# Patient Record
Sex: Female | Born: 1955 | Race: Black or African American | Hispanic: No | Marital: Married | Smoking: Never smoker
Health system: Southern US, Community
[De-identification: ages and names within clinical notes are randomized; demographics above are authoritative.]

## PROBLEM LIST (undated history)

## (undated) DIAGNOSIS — R51 Headache: Secondary | ICD-10-CM

## (undated) DIAGNOSIS — F419 Anxiety disorder, unspecified: Secondary | ICD-10-CM

## (undated) DIAGNOSIS — I10 Essential (primary) hypertension: Secondary | ICD-10-CM

## (undated) DIAGNOSIS — K219 Gastro-esophageal reflux disease without esophagitis: Secondary | ICD-10-CM

## (undated) DIAGNOSIS — R519 Headache, unspecified: Secondary | ICD-10-CM

## (undated) DIAGNOSIS — F329 Major depressive disorder, single episode, unspecified: Secondary | ICD-10-CM

## (undated) DIAGNOSIS — F32A Depression, unspecified: Secondary | ICD-10-CM

## (undated) DIAGNOSIS — M797 Fibromyalgia: Secondary | ICD-10-CM

## (undated) DIAGNOSIS — M1990 Unspecified osteoarthritis, unspecified site: Secondary | ICD-10-CM

## (undated) DIAGNOSIS — D649 Anemia, unspecified: Secondary | ICD-10-CM

## (undated) DIAGNOSIS — E0590 Thyrotoxicosis, unspecified without thyrotoxic crisis or storm: Secondary | ICD-10-CM

## (undated) HISTORY — PX: BREAST SURGERY: SHX581

## (undated) HISTORY — PX: ABDOMINAL HYSTERECTOMY: SHX81

## (undated) HISTORY — PX: HERNIA REPAIR: SHX51

---

## 2017-10-03 LAB — TSH: TSH: 0.29 — AB (ref <=5.90)

## 2018-01-17 ENCOUNTER — Ambulatory Visit: Payer: Medicare PPO | Admitting: "Endocrinology

## 2018-01-17 ENCOUNTER — Other Ambulatory Visit: Payer: Self-pay

## 2018-01-17 ENCOUNTER — Encounter: Payer: Self-pay | Admitting: "Endocrinology

## 2018-01-17 VITALS — BP 171/85 | HR 79 | Ht 62.0 in | Wt 141.6 lb

## 2018-01-17 DIAGNOSIS — E042 Nontoxic multinodular goiter: Secondary | ICD-10-CM | POA: Diagnosis not present

## 2018-01-18 NOTE — Progress Notes (Signed)
Endocrinology Consult Note                                            01/18/2018, 7:56 PM   Subjective:    Patient ID: Bridget Woods, female    DOB: 1956/05/11, PCP Bridget Minium Franki Cabot, MD   History reviewed. No pertinent past medical history. History reviewed. No pertinent surgical history. Social History   Socioeconomic History  . Marital status: Married    Spouse name: Not on file  . Number of children: Not on file  . Years of education: Not on file  . Highest education level: Not on file  Occupational History  . Not on file  Social Needs  . Financial resource strain: Not on file  . Food insecurity:    Worry: Not on file    Inability: Not on file  . Transportation needs:    Medical: Not on file    Non-medical: Not on file  Tobacco Use  . Smoking status: Never Smoker  . Smokeless tobacco: Never Used  Substance and Sexual Activity  . Alcohol use: Not on file  . Drug use: Not on file  . Sexual activity: Not on file  Lifestyle  . Physical activity:    Days per week: Not on file    Minutes per session: Not on file  . Stress: Not on file  Relationships  . Social connections:    Talks on phone: Not on file    Gets together: Not on file    Attends religious service: Not on file    Active member of club or organization: Not on file    Attends meetings of clubs or organizations: Not on file    Relationship status: Not on file  Other Topics Concern  . Not on file  Social History Narrative  . Not on file   Outpatient Encounter Medications as of 01/17/2018  Medication Sig  . ALPRAZolam (XANAX PO) Take 3 mg by mouth 3 (three) times daily as needed (anxiety).  Marland Kitchen atorvastatin (LIPITOR) 10 MG tablet Take 10 mg by mouth daily.  Marland Kitchen diltiazem (CARDIZEM CD) 180 MG 24 hr capsule Take 180 mg by mouth daily.  . DULoxetine (CYMBALTA) 60 MG capsule Take 60 mg by mouth 2 (two) times daily.  . eszopiclone (LUNESTA) 1 MG TABS tablet Take 1 mg by mouth at bedtime as needed for  sleep. Take immediately before bedtime  . FOLIC ACID PO Take 1 tablet by mouth daily.  . meloxicam (MOBIC) 15 MG tablet Take 15 mg by mouth daily.  . propranolol (INDERAL) 60 MG tablet Take 60 mg by mouth 3 (three) times daily.   No facility-administered encounter medications on file as of 01/17/2018.    ALLERGIES: Allergies  Allergen Reactions  . Naproxen     Other reaction(s): Contact Dermatitis  . Ciprofibrate Nausea Only    VACCINATION STATUS:  There is no immunization history on file for this patient.  HPI Bridget Woods is 62 y.o. female who presents today with a medical history as above. she is being seen in consultation for suppressed TSH requested by Bridget Diamond, MD.  Her history involves large multinodular goiter  which was documented at least from 2017.  She underwent biopsy of left lobe of her thyroid in March 2018 with benign finding findings.  Her most recent thyroid ultrasound from  February 2019 showed left lobe thyroid large measuring 7.3 cm x 3.8 cm x 3.0 cm, nodular appearance.  Right lobe measuring 5.6 cm x 1.7 cm x 1.9 cm, 2 nodules measuring 1.5 cm and 0.7 cm.  She has been dealing including dysphagia, odynophagia, change in her voice, coughing spells especially at night.  She reports that 1 of her Prilosec diagnosed with thyroid cancer at age 77.  She denies any exposure to neck radiation.  She has never required antithyroid therapy nor thyroid hormone supplements. She was recently found to have suppressed TSH of 0.29, denies palpitations, heat/cold intolerance, tremors, rapid weight change.  Review of Systems  Constitutional: no weight gain/loss, + fatigue, no subjective hyperthermia, no subjective hypothermia Eyes: no blurry vision, no xerophthalmia ENT: no sore throat, +nodules palpated in thyroid, +dysphagia, +odynophagia, + voice hoarseness Cardiovascular: no Chest Pain, + Shortness of Breath, no palpitations, no leg swelling Respiratory: + cough, +  SOB Gastrointestinal: no Nausea/Vomiting/Diarhhea Musculoskeletal: no muscle/joint aches Skin: no rashes Neurological: no tremors, no numbness, no tingling, no dizziness Psychiatric: no depression, no anxiety  Objective:    BP (!) 171/85 (BP Location: Left Arm, Patient Position: Sitting, Cuff Size: Normal)   Pulse 79   Ht  (1.575 m)   Wt 141 lb 9.6 oz (64.2 kg)   BMI 25.90 kg/m   Wt Readings from Last 3 Encounters:  01/17/18 141 lb 9.6 oz (64.2 kg)    Physical Exam  Constitutional: + Appropriate weight for height, not in acute distress, normal state of mind Eyes: PERRLA, EOMI, no exophthalmos ENT: moist mucous membranes, + large thyromegaly, no cervical lymphadenopathy Cardiovascular: normal precordial activity, Regular Rate and Rhythm, no Murmur/Rubs/Gallops Respiratory:  adequate breathing efforts, no gross chest deformity, Clear to auscultation bilaterally Gastrointestinal: abdomen soft, Non -tender, No distension, Bowel Sounds present Musculoskeletal: no gross deformities, strength intact in all four extremities Skin: moist, warm, no rashes Neurological: no tremor with outstretched hands, Deep tendon reflexes normal in all four extremities.   October 03, 2017 TSH 0.29 -She was known and documented to have large multinodular goiter from September 2017. She underwent biopsy of left lobe of her thyroid in March 2018 with benign finding findings.  Her most recent thyroid ultrasound from February 2019 showed left lobe thyroid to be large measuring 7.3 cm x 3.8 cm x 3.0 cm, nodular appearance-no major change since the last study.  Right lobe measuring 5.6 cm x 1.7 cm x 1.9 cm, 2 new nodules measuring 1.5 cm and 0.7 cm.  Assessment & Plan:   1. Multinodular goiter - Bridget Woods  is being seen at a kind request of Pallone, Franki Cabot, MD. - I have reviewed her available thyroid records and clinically evaluated the patient. - Based on reviews, she has a large multinodular  goiter with significant compressive and compromising neck symptoms.  -Even though her recent ultrasound was reported to be relatively stable compared to 2017 study, that nodules on the right lobe of her thyroid are new. -2 options were discussed with the patient including biopsy of this no nodules on the right lobe of her thyroid and observe or option of direct thyroidectomy. -Given her significant compressive symptoms and family history of thyroid cancer in her brother, I favored option 2 which is direct thyroidectomy with the need for thyroid hormone replacement for life.  Patient agrees with my recommendation.  I will refer her for total thyroidectomy to Dr. Franky Macho.  - she will return to clinic with thyroid  function test after her surgery.  She will not require intervention for slightly suppressed TSH for now -  - I did not initiate any new prescriptions today. - I advised her  to maintain close follow up with Bridget Minium Franki Cabot, MD for primary care needs.   - Time spent with the patient: 45 minutes, of which >50% was spent in obtaining information about her symptoms, reviewing her previous labs, evaluations, and treatments, counseling her about her large multinodular goiter with compressive symptoms , and developing a plan for long term treatment.  Bridget Woods participated in the discussions, expressed understanding, and voiced agreement with the above plans.  All questions were answered to her satisfaction. she is encouraged to contact clinic should she have any questions or concerns prior to her return visit.  Follow up plan: Return in about 8 weeks (around 03/14/2018) for follow up with labs after surgery.   Marquis Lunch, MD [facility] [address] [place], [place] [address] Phone: [phone]  Fax: [phone]     01/18/2018, 7:56 PM  This note was partially dictated with voice recognition software. Similar sounding  words can be transcribed inadequately or may not  be corrected upon review.

## 2018-02-04 ENCOUNTER — Encounter: Payer: Self-pay | Admitting: General Surgery

## 2018-02-04 ENCOUNTER — Ambulatory Visit: Payer: Medicare PPO | Admitting: General Surgery

## 2018-02-04 VITALS — BP 133/59 | HR 68 | Temp 98.4°F | Resp 16 | Wt 148.0 lb

## 2018-02-04 DIAGNOSIS — E042 Nontoxic multinodular goiter: Secondary | ICD-10-CM

## 2018-02-04 NOTE — Patient Instructions (Signed)
Thyroidectomy A thyroidectomy is a surgery that is done to remove all (total thyroidectomy) or part (subtotal thyroidectomy) of your thyroid gland. The thyroid is a butterfly-shaped gland that is located at the lower front of your neck. It produces a substance that helps to control certain body processes (thyroid hormone). The amount of thyroid gland tissue that is removed during your thyroidectomy depends on the reason you need the procedure. You may have a thyroidectomy to treat conditions including:  Thyroid nodules.  Thyroid cancer.  Benign thyroid tumors.  Goiter.  Overactive thyroid gland (hyperthyroidism).  There are different ways to do a thyroidectomy:  Conventional thyroidectomy (open thyroidectomy). This procedure is the most common. In this procedure, the thyroid gland is removed through one surgical cut (incision) in the neck.  Endoscopic thyroidectomy. This procedure is less invasive. In this procedure, there may be several smaller incisions in the neck, chest, or armpit. The surgeon uses a tiny camera and other assistive tools to remove the thyroid gland.  Tell a health care provider about:  Any allergies you have.  All medicines you are taking, including vitamins, herbs, eye drops, creams, and over-the-counter medicines.  Any problems you or family members have had with anesthetic medicines.  Any blood disorders you have.  Any surgeries you have had.  Any medical conditions you have. What are the risks? Generally, this is a safe procedure. However, problems can occur and include:  A decrease in parathyroid hormone levels (hypoparathyroidism). Your parathyroid glands are located behind your thyroid gland, and they maintain the calcium level in your body. If these glands are damaged during surgery, your calcium level will drop. This will make your nerves irritable and cause muscle spasms.  An increase in thyroid hormone.  Damage to the nerves of your voice box  (larynx).  Bleeding.  Infection at the site of the incision or incisions.  Temporary breathing difficulties. This is a very rare complication. It usually goes away within weeks.  What happens before the procedure?  Your health care provider will perform a physical exam and assess your voice for vocal changes.  Ask your health care provider about: ? Changing or stopping your regular medicines. This is especially important if you are taking diabetes medicines or blood thinners. ? Taking medicines such as aspirin and ibuprofen. These medicines can thin your blood. Do not take these medicines before your procedure if your health care provider instructs you not to.  Follow instructions from your health care provider about eating or drinking restrictions. What happens during the procedure? You will be given a medicine that makes you go to sleep (general anesthetic). Depending on which type of thyroidectomy you have, this is what may happen during the procedure: Conventional Thyroidectomy  The surgeon will make an incision in the center of your lower neck.  The muscles in your neck will be separated to reveal your thyroid gland.  Part or all of your thyroid gland will be removed.  You may need a tube (catheter) at the incision site to drain blood and fluids that accumulate under the skin after the procedure.The catheter may have to stay in place for a day or two after the procedure.  The incision will be closed with stitches (sutures). Endoscopic Thyroidectomy  The surgeon will make several small incisions in your neck, chest, or armpit.  The surgeon will use a narrow tube with a light and camera at the end (endoscope). The surgeon will insert the endoscope into an incision.  Part or   all of your thyroid gland will be removed.  You may need a catheter at the incision site to drain blood and fluids that accumulate under the skin after the procedure.The catheter may have to stay in  place for a day or two after the procedure.  The incision will be closed with sutures. What happens after the procedure?  Your blood pressure, heart rate, breathing rate, and blood oxygen level will be monitored often until the medicines you were given have worn off.  Depending on the type of thyroidectomy you had, you may have: ? A swollen neck. ? Some mild neck pain. ? A slightly sore throat. ? A weak voice.  You will not be able to eat or drink until your health care provider says it is okay.  You may have a blood test to check the level of calcium in your body.  If you had a catheter put in during the procedure, it will usually be removed the next day. This information is not intended to replace advice given to you by your health care provider. Make sure you discuss any questions you have with your health care provider. Document Released: 02/27/2001 Document Revised: 05/06/2016 Document Reviewed: 02/03/2014 Elsevier Interactive Patient Education  2018 [employer] Goiter A goiter is an enlarged thyroid gland. The thyroid gland is located in the lower front of the neck. The gland produces hormones that regulate mood, body temperature, pulse rate, and digestion. Most goiters are painless and are not a cause for serious concern. Goiters and conditions that cause goiters can be treated, if necessary. What are the causes? Causes of this condition include:  Diseases that attack healthy cells in your body (autoimmune diseases) and affect your thyroid function, such as: ? Graves disease. This causes too much thyroid hormone to be produced and it makes your thyroid overly active (hyperthyroidism). ? Hashimoto disease. This type of inflammation of the thyroid (thyroiditis) causes too little thyroid hormone to be produced and it makes your thyroid not active enough (hypothyroidism).  Other conditions that cause thyroiditis.  Nodular goiter. This means that there are one or more small  growths on your thyroid. These can create too much thyroid hormone.  Pregnancy.  Thyroid cancer. This is rare.  Certain medicines.  Radiation exposure.  Iodine deficiency.  In some cases, the cause may not be known (idiopathic). What increases the risk? This condition is more likely to develop in:  People who have a family history of goiter.  Women.  People who do not get enough iodine in their diet.  People who are older than 40.  People who smoke tobacco.  What are the signs or symptoms? Common symptoms of this condition include:  Swelling in the lower part of the neck. This swelling can range from a very small bump to a large lump.  A tight feeling in the throat.  A hoarse voice.  Other symptoms include:  Coughing.  Wheezing.  Difficulty swallowing.  Difficulty breathing.  Bulging neck veins.  Dizziness.  In some cases, there are no symptoms and thyroid hormone levels may be normal. When a goiter is the result of hyperthyroidism, symptoms may also include:  Nervousness or restlessness.  Inability to tolerate heat.  Unexplained weight loss.  Diarrhea.  Change in the texture of hair or skin.  Changes in heart beat, such as skipped beats, extra beats, or a rapid heart rate.  Loss of menstruation.  Shaky hands.  Increased appetite.  Sleep problems.  When a goiter   is the result of hypothyroidism, symptoms may also include:  Feeling like you have no energy (lethargy).  Inability to tolerate cold.  Weight gain that is not explained by a change in diet or exercise habits.  Dry skin.  Coarse hair.  Menstrual irregularity.  Constipation.  Sadness or depression.  How is this diagnosed? This condition may be diagnosed with a medical history and physical exam. You may also have other tests, including:  Blood tests to check thyroid function.  Imaging tests, such as: ? Ultrasonography. ? CT scan. ? MRI. ? Thyroid scan. You will be  given a safe radioactive injection, then images will be taken of your thyroid.  Tissue sample (biopsy) of the goiter or any nodules. This checks to see if the goiter or nodules are cancerous.  How is this treated? Treatment for this condition depends on the cause. Treatment may include:  Medicines to control your thyroid.  Anti-inflammatory or steroid medicines, if inflammation is the cause.  Iodine supplements or changes in diet, if the goiter is caused by iodine deficiency.  Radiation therapy.  Surgery to remove your thyroid.  In some cases, no treatment is necessary, and your health care provider will monitor your condition at regular checkups. Follow these instructions at home:  Follow recommendations from your health care provider for any changes to your diet.  Take over-the-counter and prescription medicines only as told by your health care provider.  Do not use any tobacco products, including cigarettes, chewing tobacco, or e-cigarettes. If you need help quitting, ask your health care provider.  Keep all follow-up appointments as told by your health care provider. This is important. Contact a health care provider if:  Your symptoms do not get better with treatment. Get help right away if:  You develop sudden, unexplained confusion or other mental changes.  You have nausea, vomiting, or diarrhea.  You develop a fever.  Your skin or the whites of your eyes appear yellow (jaundice).  You develop chest pain.  You have trouble breathing or swallowing.  You suddenly become very weak.  You experience extreme restlessness. This information is not intended to replace advice given to you by your health care provider. Make sure you discuss any questions you have with your health care provider. Document Released: 02/21/2010 Document Revised: 03/23/2016 Document Reviewed: 08/30/2014 Elsevier Interactive Patient Education  2018 Elsevier Inc[employer]  

## 2018-02-04 NOTE — Progress Notes (Signed)
Bridget Woods; [id number]; 1955-09-30   HPI Patient is a 62 year old black female who was referred to my care by Dr. Fransico Him for evaluation and treatment of a multinodular goiter.  She has been referred for a total thyroidectomy.  Patient has had some voice changes recently as well as dysphasia.  She denies any neck pain.  She does have a brother who was diagnosed with thyroid cancer at the age of 43.  She has had a history of hypertension which has been well controlled recently.  She denies any heat intolerance, history of neck irradiation, or weight loss.  She denies any family history of MEN syndrome or pheochromocytoma. History reviewed. No pertinent past medical history.  History reviewed. No pertinent surgical history.  History reviewed. No pertinent family history.  Current Outpatient Medications on File Prior to Visit  Medication Sig Dispense Refill  . ALPRAZolam (XANAX PO) Take 3 mg by mouth 3 (three) times daily as needed (anxiety).    Marland Kitchen aspirin EC 81 MG tablet Take 81 mg by mouth daily.    Marland Kitchen atorvastatin (LIPITOR) 10 MG tablet Take 10 mg by mouth daily.    Marland Kitchen diltiazem (CARDIZEM CD) 180 MG 24 hr capsule Take 180 mg by mouth daily.    . DULoxetine (CYMBALTA) 60 MG capsule Take 60 mg by mouth 2 (two) times daily.    . eszopiclone (LUNESTA) 1 MG TABS tablet Take 1 mg by mouth at bedtime as needed for sleep. Take immediately before bedtime    . FOLIC ACID PO Take 1 tablet by mouth daily.    Marland Kitchen HYDROcodone-acetaminophen (NORCO) 10-325 MG tablet Take 1 tablet by mouth every 6 (six) hours as needed.    . meloxicam (MOBIC) 15 MG tablet Take 15 mg by mouth daily.    . propranolol (INDERAL) 60 MG tablet Take 60 mg by mouth 3 (three) times daily.    Marland Kitchen tiZANidine (ZANAFLEX) 4 MG capsule Take 4 mg by mouth daily.    . Vitamin D, Ergocalciferol, (DRISDOL) 50000 units CAPS capsule Take 500 Units by mouth daily.     No current facility-administered medications on file prior to visit.     Allergies   Allergen Reactions  . Naproxen     Other reaction(s): Contact Dermatitis  . Ciprofibrate Nausea Only    Social History   Substance and Sexual Activity  Alcohol Use Not on file    Social History   Tobacco Use  Smoking Status Never Smoker  Smokeless Tobacco Never Used    Review of Systems  Constitutional: Positive for malaise/fatigue.  HENT: Positive for sinus pain.   Eyes: Negative.   Respiratory: Negative.   Cardiovascular: Negative.   Gastrointestinal: Positive for abdominal pain and heartburn.  Genitourinary: Negative.   Musculoskeletal: Positive for back pain and joint pain.  Skin: Negative.   Neurological: Positive for headaches.  Endo/Heme/Allergies: Negative.   Psychiatric/Behavioral: Negative.     Objective   Vitals:   02/04/18 1049  BP: (!) 133/59  Pulse: 68  Resp: 16  Temp: 98.4 F (36.9 C)    Physical Exam  Constitutional: She is oriented to person, place, and time. She appears well-developed and well-nourished.  HENT:  Head: Normocephalic and atraumatic.  Neck: Normal range of motion. Neck supple. No tracheal deviation present. Thyromegaly present.  Multinodular goiter, left lobe greater than right lobe.  Cardiovascular: Normal rate, regular rhythm and normal heart sounds. Exam reveals no gallop and no friction rub.  No murmur heard. Pulmonary/Chest: Effort normal and breath  sounds normal. No stridor. No respiratory distress. She has no wheezes. She has no rales.  Lymphadenopathy:    She has no cervical adenopathy.  Neurological: She is alert and oriented to person, place, and time.  Skin: Skin is warm and dry.  Vitals reviewed. Dr. Isidoro Donning notes reviewed.  Assessment  Multinodular goiter, hypertension, family history of thyroid cancer Plan   Agree that patient needs total thyroidectomy.  Will discuss with Dr. Fransico Him as to whether patient needs to be worked up for MEN syndrome.  The risks and benefits of the procedure including bleeding,  infection, nerve injury, and the possibility of malignancy were fully explained to the patient, who gave informed consent.

## 2018-02-12 ENCOUNTER — Ambulatory Visit (INDEPENDENT_AMBULATORY_CARE_PROVIDER_SITE_OTHER): Payer: Medicare PPO | Admitting: "Endocrinology

## 2018-02-12 ENCOUNTER — Encounter: Payer: Self-pay | Admitting: "Endocrinology

## 2018-02-12 VITALS — BP 131/75 | HR 56 | Ht 62.0 in | Wt 141.0 lb

## 2018-02-12 DIAGNOSIS — E042 Nontoxic multinodular goiter: Secondary | ICD-10-CM

## 2018-02-12 NOTE — Progress Notes (Signed)
Endocrinology Consult Note                                            02/12/2018, 6:40 PM   Subjective:    Patient ID: Bridget Woods, female    DOB: [birth year], PCP Reynaldo Minium Franki Cabot, MD   History reviewed. No pertinent past medical history. History reviewed. No pertinent surgical history. Social History   Socioeconomic History  . Marital status: Married    Spouse name: Not on file  . Number of children: Not on file  . Years of education: Not on file  . Highest education level: Not on file  Occupational History  . Not on file  Social Needs  . Financial resource strain: Not on file  . Food insecurity:    Worry: Not on file    Inability: Not on file  . Transportation needs:    Medical: Not on file    Non-medical: Not on file  Tobacco Use  . Smoking status: Never Smoker  . Smokeless tobacco: Never Used  Substance and Sexual Activity  . Alcohol use: Not on file  . Drug use: Not on file  . Sexual activity: Not on file  Lifestyle  . Physical activity:    Days per week: Not on file    Minutes per session: Not on file  . Stress: Not on file  Relationships  . Social connections:    Talks on phone: Not on file    Gets together: Not on file    Attends religious service: Not on file    Active member of club or organization: Not on file    Attends meetings of clubs or organizations: Not on file    Relationship status: Not on file  Other Topics Concern  . Not on file  Social History Narrative  . Not on file   Outpatient Encounter Medications as of 02/12/2018  Medication Sig  . hydrALAZINE (APRESOLINE) 25 MG tablet Take 25 mg by mouth 3 (three) times daily.  Marland Kitchen ALPRAZolam (XANAX PO) Take 3 mg by mouth 3 (three) times daily as needed (anxiety).  Marland Kitchen aspirin EC 81 MG tablet Take 81 mg by mouth daily.  Marland Kitchen atorvastatin (LIPITOR) 10 MG tablet Take 10 mg by mouth daily.  Marland Kitchen diltiazem (CARDIZEM CD) 180 MG 24 hr capsule Take 180 mg by mouth daily.  . DULoxetine (CYMBALTA)  60 MG capsule Take 60 mg by mouth 2 (two) times daily.  . eszopiclone (LUNESTA) 1 MG TABS tablet Take 1 mg by mouth at bedtime as needed for sleep. Take immediately before bedtime  . FOLIC ACID PO Take 1 tablet by mouth daily.  Marland Kitchen HYDROcodone-acetaminophen (NORCO) 10-325 MG tablet Take 1 tablet by mouth every 6 (six) hours as needed.  . meloxicam (MOBIC) 15 MG tablet Take 15 mg by mouth daily.  . propranolol (INDERAL) 60 MG tablet Take 60 mg by mouth 3 (three) times daily.  Marland Kitchen tiZANidine (ZANAFLEX) 4 MG capsule Take 4 mg by mouth daily.  . Vitamin D, Ergocalciferol, (DRISDOL) 50000 units CAPS capsule Take 500 Units by mouth daily.   No facility-administered encounter medications on file as of 02/12/2018.    ALLERGIES: Allergies  Allergen Reactions  . Naproxen     Other reaction(s): Contact Dermatitis  . Ciprofibrate Nausea Only    VACCINATION STATUS:  There is no immunization history  on file for this patient.  HPI Bridget Woods is 62 y.o. 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  She was sent for thyroidectomy.  After she saw the surgeon, due to the fact that she has a family history of thyroid cancer, she needed to be ruled out for pheochromocytoma.  She is presenting for discussing work-up to rule out pheochromocytoma.  -She has no new complaints since last visit. Her history involves large multinodular goiter  which was documented at least from 2017.  She underwent biopsy of left lobe of her thyroid in March 2018 with benign finding findings.  Her most recent thyroid ultrasound from February 2019 showed left lobe thyroid large measuring 7.3 cm x 3.8 cm x 3.0 cm, nodular appearance.  Right lobe measuring 5.6 cm x 1.7 cm x 1.9 cm, 2 nodules measuring 1.5 cm and 0.7 cm.  She has been dealing with symptoms including dysphagia, odynophagia, change in her voice, coughing spells especially at night.  She reports that 1 of her brothers was diagnosed  with thyroid cancer at age 67.  She denies any exposure to neck radiation.  She has never required antithyroid therapy nor thyroid hormone supplements. She was recently found to have suppressed TSH of 0.29, denies palpitations, heat/cold intolerance, tremors, rapid weight change.  Review of Systems  Constitutional:  + lost weight , + fatigue, no subjective hyperthermia, no subjective hypothermia Eyes: no blurry vision, no xerophthalmia ENT: no sore throat, +nodules palpated in thyroid, +dysphagia, +odynophagia, + voice hoarseness Cardiovascular: no Chest Pain, + Shortness of Breath, no palpitations, no leg swelling Respiratory: + cough, + SOB Gastrointestinal: no Nausea/Vomiting/Diarhhea Musculoskeletal: no muscle/joint aches Skin: no rashes Neurological: no tremors, no numbness, no tingling, no dizziness Psychiatric: no depression, no anxiety  Objective:    BP 131/75   Pulse (!) 56   Ht  (1.575 m)   Wt 141 lb (64 kg)   BMI 25.79 kg/m   Wt Readings from Last 3 Encounters:  02/12/18 141 lb (64 kg)  02/04/18 148 lb (67.1 kg)  01/17/18 141 lb 9.6 oz (64.2 kg)    Physical Exam  Constitutional: + Appropriate weight for height, not in acute distress, normal state of mind Eyes: PERRLA, EOMI, no exophthalmos ENT: moist mucous membranes, + large nodular thyromegaly, no cervical lymphadenopathy Cardiovascular: normal precordial activity, Regular Rate and Rhythm, no Murmur/Rubs/Gallops Respiratory:  adequate breathing efforts, no gross chest deformity, Clear to auscultation bilaterally Gastrointestinal: abdomen soft, Non -tender, No distension, Bowel Sounds present Musculoskeletal: no gross deformities, strength intact in all four extremities Skin: moist, warm, no rashes Neurological: no tremor with outstretched hands, Deep tendon reflexes normal in all four extremities.   October 03, 2017 TSH 0.29 -She was known and documented to have large multinodular goiter from September  2017. She underwent biopsy of left lobe of her thyroid in March 2018 with benign finding findings.  Her most recent thyroid ultrasound from February 2019 showed left lobe thyroid to be large measuring 7.3 cm x 3.8 cm x 3.0 cm, nodular appearance-no major change since the last study.  Right lobe measuring 5.6 cm x 1.7 cm x 1.9 cm, 2 new nodules measuring 1.5 cm and 0.7 cm.  Assessment & Plan:   1. Multinodular goiter - Bridget Woods  is being seen at a kind request of Pallone, Franki Cabot, MD. - I have reviewed her available thyroid records and clinically evaluated the patient. - Based on reviews, she has a  large multinodular goiter with significant compressive and compromising neck symptoms.  -Even though her recent ultrasound was reported to be relatively stable compared to 2017 study, those nodules on the right lobe of her thyroid are new. -she will still benefit from direct thyroidectomy. -Given her significant compressive symptoms and family history of thyroid cancer in her brother, direct thyroidectomy with the need for thyroid hormone replacement for life.  Patient agrees with my recommendation.   She will be sent to lab for 24 hour urine metanephrines, catecholamines; and plasma free metanephrines to rule out pheochromocytoma.  -If pheochromocytoma is ruled out, she will return to Dr. Franky Macho for total thyroidectomy.  - I did not initiate any new prescriptions today. - I advised her  to maintain close follow up with Reynaldo Minium Franki Cabot, MD for primary care needs.    Follow up plan: Return in about 2 weeks (around 02/26/2018) for follow up with pre-visit labs , 24 HOUR URINE STUDIES.   Marquis Lunch, MD [facility] [address] [place], [place] [address] Phone: [phone]  Fax: [phone]     02/12/2018, 6:40 PM  This note was partially dictated with voice recognition software. Similar sounding words can be transcribed  inadequately or may not  be corrected upon review.

## 2018-02-20 LAB — THYROID PEROXIDASE ANTIBODY: THYROID PEROXIDASE ANTIBODY: 1 [IU]/mL (ref <9)

## 2018-02-20 LAB — PTH, INTACT AND CALCIUM
Calcium: 9.7 mg/dL (ref 8.6–10.4)
PTH: 37 pg/mL (ref 14–64)

## 2018-02-20 LAB — METANEPHRINES, URINE, 24 HOUR
Metaneph Total, Ur: 455 mcg/24 h (ref 224–832)
Metanephrines, Ur: 104 mcg/24 h (ref 90–315)
NORMETANEPHRINE 24H UR: 351 ug/(24.h) (ref 122–676)
VOLUME, URINE-VMAUR: 2200 mL

## 2018-02-20 LAB — METANEPHRINES, PLASMA
Normetanephrine, Free: 194 pg/mL — ABNORMAL HIGH (ref <=148)
Total Metanephrines-Plasma: 194 pg/mL (ref <=205)

## 2018-02-20 LAB — T4, FREE: Free T4: 1.3 ng/dL (ref 0.8–1.8)

## 2018-02-20 LAB — TSH: TSH: 0.6 mIU/L (ref 0.40–4.50)

## 2018-02-20 LAB — THYROGLOBULIN ANTIBODY: Thyroglobulin Ab: <1 IU/mL (ref <=1)

## 2018-02-27 ENCOUNTER — Ambulatory Visit: Payer: Medicare PPO | Admitting: "Endocrinology

## 2018-02-27 ENCOUNTER — Encounter: Payer: Self-pay | Admitting: "Endocrinology

## 2018-02-27 VITALS — BP 130/76 | HR 62 | Ht 62.0 in | Wt 138.0 lb

## 2018-02-27 DIAGNOSIS — E042 Nontoxic multinodular goiter: Secondary | ICD-10-CM

## 2018-02-27 NOTE — Progress Notes (Signed)
Endocrinology follow-up  Note                                            02/27/2018, 2:08 PM   Subjective:    Patient ID: Bridget Woods, female    DOB: 02-22-1956, PCP Reynaldo Minium Franki Cabot, MD   History reviewed. No pertinent past medical history. History reviewed. No pertinent surgical history. Social History   Socioeconomic History  . Marital status: Married    Spouse name: Not on file  . Number of children: Not on file  . Years of education: Not on file  . Highest education level: Not on file  Occupational History  . Not on file  Social Needs  . Financial resource strain: Not on file  . Food insecurity:    Worry: Not on file    Inability: Not on file  . Transportation needs:    Medical: Not on file    Non-medical: Not on file  Tobacco Use  . Smoking status: Never Smoker  . Smokeless tobacco: Never Used  Substance and Sexual Activity  . Alcohol use: Not Currently  . Drug use: Never  . Sexual activity: Not on file  Lifestyle  . Physical activity:    Days per week: Not on file    Minutes per session: Not on file  . Stress: Not on file  Relationships  . Social connections:    Talks on phone: Not on file    Gets together: Not on file    Attends religious service: Not on file    Active member of club or organization: Not on file    Attends meetings of clubs or organizations: Not on file    Relationship status: Not on file  Other Topics Concern  . Not on file  Social History Narrative  . Not on file   Outpatient Encounter Medications as of 02/27/2018  Medication Sig  . ALPRAZolam (XANAX PO) Take 3 mg by mouth 3 (three) times daily as needed (anxiety).  Marland Kitchen aspirin EC 81 MG tablet Take 81 mg by mouth daily.  Marland Kitchen atorvastatin (LIPITOR) 10 MG tablet Take 10 mg by mouth daily.  Marland Kitchen diltiazem (CARDIZEM CD) 180 MG 24 hr capsule Take 180 mg by mouth daily.  . DULoxetine (CYMBALTA) 60 MG capsule Take 60 mg by mouth 2 (two) times daily.  . eszopiclone (LUNESTA) 1 MG  TABS tablet Take 1 mg by mouth at bedtime as needed for sleep. Take immediately before bedtime  . FOLIC ACID PO Take 1 tablet by mouth daily.  . hydrALAZINE (APRESOLINE) 25 MG tablet Take 25 mg by mouth 3 (three) times daily.  Marland Kitchen HYDROcodone-acetaminophen (NORCO) 10-325 MG tablet Take 1 tablet by mouth every 6 (six) hours as needed.  . meloxicam (MOBIC) 15 MG tablet Take 15 mg by mouth daily.  . propranolol (INDERAL) 60 MG tablet Take 60 mg by mouth 3 (three) times daily.  Marland Kitchen tiZANidine (ZANAFLEX) 4 MG capsule Take 4 mg by mouth daily.  . Vitamin D, Ergocalciferol, (DRISDOL) 50000 units CAPS capsule Take 500 Units by mouth daily.   No facility-administered encounter medications on file as of 02/27/2018.    ALLERGIES: Allergies  Allergen Reactions  . Naproxen     Other reaction(s): Contact Dermatitis  . Ciprofibrate Nausea Only    VACCINATION STATUS:  There is no immunization history on file  for this patient.  HPI Bridget Woods is 62 y.o. 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  She was sent for thyroidectomy.  After she saw the surgeon, due to the fact that she has a family history of thyroid cancer, she needed to be ruled out for pheochromocytoma.  -Her plasma metanephrines and 24-hour urine for fractionated metanephrines are not suggestive of pheochromocytoma.   -She has no new complaints since last visit. Her history involves large multinodular goiter  which was documented at least from 2017.  She underwent biopsy of left lobe of her thyroid in March 2018 with benign finding findings.  Her most recent thyroid ultrasound from February 2019 showed left lobe thyroid large measuring 7.3 cm x 3.8 cm x 3.0 cm, nodular appearance.  Right lobe measuring 5.6 cm x 1.7 cm x 1.9 cm, 2 nodules measuring 1.5 cm and 0.7 cm.  She has been dealing with symptoms including dysphagia, odynophagia, change in her voice, coughing spells especially at night.  She  reports that 1 of her brothers was diagnosed with thyroid cancer at age 71.  She denies any exposure to neck radiation.  She has never required antithyroid therapy nor thyroid hormone supplements. She was recently found to have suppressed TSH of 0.29, denies palpitations, heat/cold intolerance, tremors, rapid weight change.  Review of Systems  Constitutional:  + lost weight , + fatigue, no subjective hyperthermia, no subjective hypothermia Eyes: no blurry vision, no xerophthalmia ENT: no sore throat, +nodules palpated in thyroid, +dysphagia, +odynophagia, + voice hoarseness Cardiovascular: no Chest Pain, + Shortness of Breath, no palpitations, no leg swelling Respiratory: + cough, + SOB Gastrointestinal: no Nausea/Vomiting/Diarhhea Musculoskeletal: no muscle/joint aches Skin: no rashes Neurological: no tremors, no numbness, no tingling, no dizziness Psychiatric: no depression, no anxiety  Objective:    BP 130/76   Pulse 62   Ht 5\' 2"  (1.575 m)   Wt 138 lb (62.6 kg)   BMI 25.24 kg/m   Wt Readings from Last 3 Encounters:  02/27/18 138 lb (62.6 kg)  02/12/18 141 lb (64 kg)  02/04/18 148 lb (67.1 kg)    Physical Exam  Constitutional: + Appropriate weight for height, not in acute distress, normal state of mind Eyes: PERRLA, EOMI, no exophthalmos ENT: moist mucous membranes, + large nodular thyromegaly, no cervical lymphadenopathy Cardiovascular: normal precordial activity, Regular Rate and Rhythm, no Murmur/Rubs/Gallops Respiratory:  adequate breathing efforts, no gross chest deformity, Clear to auscultation bilaterally Gastrointestinal: abdomen soft, Non -tender, No distension, Bowel Sounds present Musculoskeletal: no gross deformities, strength intact in all four extremities Skin: moist, warm, no rashes Neurological: no tremor with outstretched hands, Deep tendon reflexes normal in all four extremities.   October 03, 2017 TSH 0.29 -She was known and documented to have large  multinodular goiter from September 2017. She underwent biopsy of left lobe of her thyroid in March 2018 with benign finding findings.  Her most recent thyroid ultrasound from February 2019 showed left lobe thyroid to be large measuring 7.3 cm x 3.8 cm x 3.0 cm, nodular appearance-no major change since the last study.  Right lobe measuring 5.6 cm x 1.7 cm x 1.9 cm, 2 new nodules measuring 1.5 cm and 0.7 cm.  Recent Results (from the past 2160 hour(s))  T4, free     Status: None   Collection Time: 02/17/18  9:37 AM  Result Value Ref Range   Free T4 1.3 0.8 - 1.8 ng/dL  TSH  Status: None   Collection Time: 02/17/18  9:37 AM  Result Value Ref Range   TSH 0.60 0.40 - 4.50 mIU/L  PTH, intact and calcium     Status: None   Collection Time: 02/17/18  9:37 AM  Result Value Ref Range   PTH 37 14 - 64 pg/mL    Comment: . Interpretive Guide    Intact PTH           Calcium ------------------    ----------           ------- Normal Parathyroid    Normal               Normal Hypoparathyroidism    Low or Low Normal    Low Hyperparathyroidism    Primary            Normal or High       High    Secondary          High                 Normal or Low    Tertiary           High                 High Non-Parathyroid    Hypercalcemia      Low or Low Normal    High .    Calcium 9.7 8.6 - 10.4 mg/dL  Thyroglobulin antibody     Status: None   Collection Time: 02/17/18  9:37 AM  Result Value Ref Range   Thyroglobulin Ab <1 < or = 1 IU/mL  Thyroid peroxidase antibody     Status: None   Collection Time: 02/17/18  9:37 AM  Result Value Ref Range   Thyroperoxidase Ab SerPl-aCnc 1 <9 IU/mL  Metanephrines, urine, 24 hour     Status: None   Collection Time: 02/17/18  9:37 AM  Result Value Ref Range   Volume, Urine-VMAUR 2,200 mL   Metanephrines, Ur 104 90 - 315 mcg/24 h    Comment: . This test was developed and its analytical performance characteristics have been determined by [facility] [place], [place]. It has not been cleared or approved by the U.S. Food and Drug Administration. This assay has been validated pursuant to the CLIA regulations and is used for clinical purposes. .    Normetanephrine, 24H Ur 351 122 - 676 mcg/24 h    Comment: . This test was developed and its analytical performance characteristics have been determined by [facility] [place]. It has not been cleared or approved by the U.S. Food and Drug Administration. This assay has been validated pursuant to the CLIA regulations and is used for clinical purposes. Clent Demark.    Metaneph Total, Ur 455 224 - 832 mcg/24 h    Comment: . A four fold elevation of urinary normetanephrines is extremely likely to be due to a tumor, while a four fold elevation of urinary metanephrines is highly suggestive, but not diagnostic of the tumor. Measurement of plasma Metanephrines and Chromogranin A is recommended for confirmation. .   Metanephrines, plasma     Status: Abnormal   Collection Time: 02/17/18  9:37 AM  Result Value Ref Range   Metanephrine, Free <25 <=57 pg/mL    Comment: . This test was developed and its analytical performance characteristics have been determined by [facility] [place], [place]. It has not been cleared or approved by the U.S. Food and  Drug Administration. This assay has been validated pursuant to the CLIA regulations and is used for clinical purposes. .    Normetanephrine, Free 194 (H) <=148 pg/mL    Comment: . This test was developed and its analytical performance characteristics have been determined by [facility] [place], [place]. It has not been cleared or approved by the U.S. Food and Drug Administration. This assay has been validated pursuant to the CLIA regulations and is used for clinical purposes. .    Total Metanephrines-Plasma 194 <=205 pg/mL    Comment: . For additional information,  please refer to http://education.[facility].com/faq/MetFractFree (This link is being provided for informational/educatio informational/educational purposes only.) . Elevations >4-fold upper reference range: strongly suggestive of a pheochromocytoma(1). . Elevations >1- 4-fold upper reference range: significant but not diagnostic, may be due to medications or stress. Suggest running 24 hr urine fractionated metanephrines and/or serum Chromagranin A for confirmation. . Reference: . (1)Algeciras-Schimnich A et al, Plasma Chromogranin A or Urine Fractionated Metanephrines Follow-Up Testing Improves the Diagnostic Accuracy of Plasma Fractionated Metanephrines for Pheochromocytoma. The Journal of Clinical Endocrinology # Metabolism 93(1), 91-95, 2008. . . . This test was developed and its analytical performance characteristics have been determined by [facility] [place], [place]. It has not been cleared or a pproved by the U.S. Food and Drug Administration. This assay has been validated pursuant to the CLIA regulations and is used for clinical purposes. .      Assessment & Plan:   1. Multinodular goiter - Bridget Woods  is being seen at a kind request of Pallone, Franki Cabot, MD. - I have reviewed her available thyroid records and clinically evaluated the patient. - Based on reviews, she has a large multinodular goiter with significant compressive and compromising neck symptoms.  -Even though her recent ultrasound was reported to be relatively stable compared to 2017 study, those nodules on the right lobe of her thyroid are new. -she will still benefit from direct thyroidectomy. -Given her significant compressive symptoms and family history of thyroid cancer in her brother, direct thyroidectomy with the need for thyroid hormone replacement for life.  Patient agrees with my recommendation.  -Her plasma metanephrines and 24-hour urine fractionated  metanephrines are not suggestive of pheochromocytoma.  - she is advised to return to Dr. Franky Macho for total thyroidectomy.  Please send a copy of this note to Dr. Lovell Sheehan.  - I did not initiate any new prescriptions today. - I advised her  to maintain close follow up with Reynaldo Minium Franki Cabot, MD for primary care needs.  Follow up plan: Return in about 8 weeks (around 04/24/2018) for follow up with labs after surgery.   Marquis Lunch, MD [facility] [address] [place], [place] [address] Phone: [phone]  Fax: [phone]     02/27/2018, 2:08 PM  This note was partially dictated with voice recognition software. Similar sounding words can be transcribed inadequately or may not  be corrected upon review.

## 2018-03-28 NOTE — H&P (Signed)
Bridget Woods; [id number]; 1955-09-30   HPI Patient is a 62 year old black female who was referred to my care by Dr. Fransico Him for evaluation and treatment of a multinodular goiter.  She has been referred for a total thyroidectomy.  Patient has had some voice changes recently as well as dysphasia.  She denies any neck pain.  She does have a brother who was diagnosed with thyroid cancer at the age of 43.  She has had a history of hypertension which has been well controlled recently.  She denies any heat intolerance, history of neck irradiation, or weight loss.  She denies any family history of MEN syndrome or pheochromocytoma. History reviewed. No pertinent past medical history.  History reviewed. No pertinent surgical history.  History reviewed. No pertinent family history.  Current Outpatient Medications on File Prior to Visit  Medication Sig Dispense Refill  . ALPRAZolam (XANAX PO) Take 3 mg by mouth 3 (three) times daily as needed (anxiety).    Marland Kitchen aspirin EC 81 MG tablet Take 81 mg by mouth daily.    Marland Kitchen atorvastatin (LIPITOR) 10 MG tablet Take 10 mg by mouth daily.    Marland Kitchen diltiazem (CARDIZEM CD) 180 MG 24 hr capsule Take 180 mg by mouth daily.    . DULoxetine (CYMBALTA) 60 MG capsule Take 60 mg by mouth 2 (two) times daily.    . eszopiclone (LUNESTA) 1 MG TABS tablet Take 1 mg by mouth at bedtime as needed for sleep. Take immediately before bedtime    . FOLIC ACID PO Take 1 tablet by mouth daily.    Marland Kitchen HYDROcodone-acetaminophen (NORCO) 10-325 MG tablet Take 1 tablet by mouth every 6 (six) hours as needed.    . meloxicam (MOBIC) 15 MG tablet Take 15 mg by mouth daily.    . propranolol (INDERAL) 60 MG tablet Take 60 mg by mouth 3 (three) times daily.    Marland Kitchen tiZANidine (ZANAFLEX) 4 MG capsule Take 4 mg by mouth daily.    . Vitamin D, Ergocalciferol, (DRISDOL) 50000 units CAPS capsule Take 500 Units by mouth daily.     No current facility-administered medications on file prior to visit.     Allergies   Allergen Reactions  . Naproxen     Other reaction(s): Contact Dermatitis  . Ciprofibrate Nausea Only    Social History   Substance and Sexual Activity  Alcohol Use Not on file    Social History   Tobacco Use  Smoking Status Never Smoker  Smokeless Tobacco Never Used    Review of Systems  Constitutional: Positive for malaise/fatigue.  HENT: Positive for sinus pain.   Eyes: Negative.   Respiratory: Negative.   Cardiovascular: Negative.   Gastrointestinal: Positive for abdominal pain and heartburn.  Genitourinary: Negative.   Musculoskeletal: Positive for back pain and joint pain.  Skin: Negative.   Neurological: Positive for headaches.  Endo/Heme/Allergies: Negative.   Psychiatric/Behavioral: Negative.     Objective   Vitals:   02/04/18 1049  BP: (!) 133/59  Pulse: 68  Resp: 16  Temp: 98.4 F (36.9 C)    Physical Exam  Constitutional: She is oriented to person, place, and time. She appears well-developed and well-nourished.  HENT:  Head: Normocephalic and atraumatic.  Neck: Normal range of motion. Neck supple. No tracheal deviation present. Thyromegaly present.  Multinodular goiter, left lobe greater than right lobe.  Cardiovascular: Normal rate, regular rhythm and normal heart sounds. Exam reveals no gallop and no friction rub.  No murmur heard. Pulmonary/Chest: Effort normal and breath  sounds normal. No stridor. No respiratory distress. She has no wheezes. She has no rales.  Lymphadenopathy:    She has no cervical adenopathy.  Neurological: She is alert and oriented to person, place, and time.  Skin: Skin is warm and dry.  Vitals reviewed. Dr. Isidoro DonningNida's notes reviewed.  Assessment  Multinodular goiter, hypertension, family history of thyroid cancer Plan   Agree that patient needs total thyroidectomy.  Will discuss with Dr. Fransico HimNida as to whether patient needs to be worked up for MEN syndrome.  The risks and benefits of the procedure including bleeding,  infection, nerve injury, and the possibility of malignancy were fully explained to the patient, who gave informed consent.

## 2018-04-04 NOTE — Patient Instructions (Signed)
Bridget Woods  04/04/2018     @PREFPERIOPPHARMACY @   Your procedure is scheduled on  04/14/2018 .  Report to [facility] at  845  A.M.  Call this number if you have problems the morning of surgery:  [phone]   Remember:  Do not eat or drink after midnight.  You may drink clear liquids until  12 midnight 04/13/2018  .  Clear liquids allowed are:                    Water, Juice (non-citric and without pulp), Carbonated beverages, Clear Tea, Black Coffee only, Plain Jell-O only, Gatorade and Plain Popsicles only    Take these medicines the morning of surgery with A SIP OF WATER  Xanax, diltiazem, cymbalta, apresoline, hydrocodone or mobic, propranolol, zantac, zanaflex.    Do not wear jewelry, make-up or nail polish.  Do not wear lotions, powders, or perfumes, or deodorant.  Do not shave 48 hours prior to surgery.  Men may shave face and neck.  Do not bring valuables to the hospital.  [facility] is not responsible for any belongings or valuables.  Contacts, dentures or bridgework may not be worn into surgery.  Leave your suitcase in the car.  After surgery it may be brought to your room.  For patients admitted to the hospital, discharge time will be determined by your treatment team.  Patients discharged the day of surgery will not be allowed to drive home.   Name and phone number of your driver:   family Special instructions:  None  Please read over the following fact sheets that you were given. Pain Booklet, Coughing and Deep Breathing, Blood Transfusion Information, Lab Information, Surgical Site Infection Prevention, Anesthesia Post-op Instructions and Care and Recovery After Surgery       Thyroidectomy A thyroidectomy is a surgery that is done to remove all (total thyroidectomy) or part (subtotal thyroidectomy) of your thyroid gland. The thyroid is a butterfly-shaped gland that is located at the lower front of your neck. It produces a substance that helps  to control certain body processes (thyroid hormone). The amount of thyroid gland tissue that is removed during your thyroidectomy depends on the reason you need the procedure. You may have a thyroidectomy to treat conditions including:  Thyroid nodules.  Thyroid cancer.  Benign thyroid tumors.  Goiter.  Overactive thyroid gland (hyperthyroidism).  There are different ways to do a thyroidectomy:  Conventional thyroidectomy (open thyroidectomy). This procedure is the most common. In this procedure, the thyroid gland is removed through one surgical cut (incision) in the neck.  Endoscopic thyroidectomy. This procedure is less invasive. In this procedure, there may be several smaller incisions in the neck, chest, or armpit. The surgeon uses a tiny camera and other assistive tools to remove the thyroid gland.  Tell a health care provider about:  Any allergies you have.  All medicines you are taking, including vitamins, herbs, eye drops, creams, and over-the-counter medicines.  Any problems you or family members have had with anesthetic medicines.  Any blood disorders you have.  Any surgeries you have had.  Any medical conditions you have. What are the risks? Generally, this is a safe procedure. However, problems can occur and include:  A decrease in parathyroid hormone levels (hypoparathyroidism). Your parathyroid glands are located behind your thyroid gland, and they maintain the calcium level in your body. If these glands are damaged during surgery, your calcium level  will drop. This will make your nerves irritable and cause muscle spasms.  An increase in thyroid hormone.  Damage to the nerves of your voice box (larynx).  Bleeding.  Infection at the site of the incision or incisions.  Temporary breathing difficulties. This is a very rare complication. It usually goes away within weeks.  What happens before the procedure?  Your health care provider will perform a physical  exam and assess your voice for vocal changes.  Ask your health care provider about: ? Changing or stopping your regular medicines. This is especially important if you are taking diabetes medicines or blood thinners. ? Taking medicines such as aspirin and ibuprofen. These medicines can thin your blood. Do not take these medicines before your procedure if your health care provider instructs you not to.  Follow instructions from your health care provider about eating or drinking restrictions. What happens during the procedure? You will be given a medicine that makes you go to sleep (general anesthetic). Depending on which type of thyroidectomy you have, this is what may happen during the procedure: Conventional Thyroidectomy  The surgeon will make an incision in the center of your lower neck.  The muscles in your neck will be separated to reveal your thyroid gland.  Part or all of your thyroid gland will be removed.  You may need a tube (catheter) at the incision site to drain blood and fluids that accumulate under the skin after the procedure.The catheter may have to stay in place for a day or two after the procedure.  The incision will be closed with stitches (sutures). Endoscopic Thyroidectomy  The surgeon will make several small incisions in your neck, chest, or armpit.  The surgeon will use a narrow tube with a light and camera at the end (endoscope). The surgeon will insert the endoscope into an incision.  Part or all of your thyroid gland will be removed.  You may need a catheter at the incision site to drain blood and fluids that accumulate under the skin after the procedure.The catheter may have to stay in place for a day or two after the procedure.  The incision will be closed with sutures. What happens after the procedure?  Your blood pressure, heart rate, breathing rate, and blood oxygen level will be monitored often until the medicines you were given have worn  off.  Depending on the type of thyroidectomy you had, you may have: ? A swollen neck. ? Some mild neck pain. ? A slightly sore throat. ? A weak voice.  You will not be able to eat or drink until your health care provider says it is okay.  You may have a blood test to check the level of calcium in your body.  If you had a catheter put in during the procedure, it will usually be removed the next day. This information is not intended to replace advice given to you by your health care provider. Make sure you discuss any questions you have with your health care provider. Document Released: 02/27/2001 Document Revised: 05/06/2016 Document Reviewed: 02/03/2014 Elsevier Interactive Patient Education  2018 [employer] Thyroidectomy, Care After Refer to this sheet in the next few weeks. These instructions provide you with information about caring for yourself after your procedure. Your health care provider may also give you more specific instructions. Your treatment has been planned according to current medical practices, but problems sometimes occur. Call your health care provider if you have any problems or questions after your procedure.  What can I expect after the procedure? After your procedure, it is typical to have:  Mild pain in the neck or upper body, especially when swallowing.  A sore throat.  A weak voice.  Follow these instructions at home:  Take medicines only as directed by your health care provider.  If your entire thyroid gland was removed, you may need to take thyroid hormone medicine from now on.  Do not take medicines that contain aspirin and ibuprofen until your health care provider says that you can. These medicines can increase your risk of bleeding.  Some pain medicines cause constipation. Drink enough fluid to keep your urine clear or pale yellow. This can help to prevent constipation.  Start slowly with eating. You may need to have only liquids and soft foods  for a few days or as directed by your health care provider.  Do not take baths, swim, or use a hot tub until your health care provider approves.  There are many different ways to close and cover an incision, including stitches (sutures), skin glue, and adhesive strips. Follow your health care provider's instructions for: ? Incision care. ? Bandage (dressing) changes and removal. ? Incision closure removal.  Resume your usual activities as directed by your health care provider.  For the first 10 days after the procedure or as instructed by your health care provider: ? Do not lift anything heavier than 20 lb (9.1 kg). ? Do not jog, swim, or do other strenuous exercises. ? Do not play contact sports.  Keep all follow-up visits as directed by your health care provider. This is important. Contact a health care provider if:  The soreness in your throat gets worse.  You have increased pain at your incision or incisions.  You have increased bleeding from an incision.  Your incision becomes infected. Watch for: ? Swelling. ? Redness. ? Warmth. ? Pus.  You notice a bad smell coming from an incision or dressing.  You have a fever.  You feel lightheaded or faint.  You have numbness, tingling, or muscle spasms in your: ? Arms. ? Hands. ? Feet. ? Face.  You have trouble swallowing. Get help right away if:  You develop a rash.  You have difficulty breathing.  You hear whistling noises coming from your chest.  You develop a cough that gets worse.  Your speech changes, or you have hoarseness that gets worse. This information is not intended to replace advice given to you by your health care provider. Make sure you discuss any questions you have with your health care provider. Document Released: 03/23/2005 Document Revised: 05/06/2016 Document Reviewed: 02/03/2014 Elsevier Interactive Patient Education  2018 [employer].  General Anesthesia, Adult General anesthesia is the  use of medicines to make a person "go to sleep" (be unconscious) for a medical procedure. General anesthesia is often recommended when a procedure:  Is long.  Requires you to be still or in an unusual position.  Is major and can cause you to lose blood.  Is impossible to do without general anesthesia.  The medicines used for general anesthesia are called general anesthetics. In addition to making you sleep, the medicines:  Prevent pain.  Control your blood pressure.  Relax your muscles.  Tell a health care provider about:  Any allergies you have.  All medicines you are taking, including vitamins, herbs, eye drops, creams, and over-the-counter medicines.  Any problems you or family members have had with anesthetic medicines.  Types of anesthetics you have  had in the past.  Any bleeding disorders you have.  Any surgeries you have had.  Any medical conditions you have.  Any history of heart or lung conditions, such as heart failure, sleep apnea, or chronic obstructive pulmonary disease (COPD).  Whether you are pregnant or may be pregnant.  Whether you use tobacco, alcohol, marijuana, or street drugs.  Any history of Financial planner.  Any history of depression or anxiety. What are the risks? Generally, this is a safe procedure. However, problems may occur, including:  Allergic reaction to anesthetics.  Lung and heart problems.  Inhaling food or liquids from your stomach into your lungs (aspiration).  Injury to nerves.  Waking up during your procedure and being unable to move (rare).  Extreme agitation or a state of mental confusion (delirium) when you wake up from the anesthetic.  Air in the bloodstream, which can lead to stroke.  These problems are more likely to develop if you are having a major surgery or if you have an advanced medical condition. You can prevent some of these complications by answering all of your health care provider's questions  thoroughly and by following all pre-procedure instructions. General anesthesia can cause side effects, including:  Nausea or vomiting  A sore throat from the breathing tube.  Feeling cold or shivery.  Feeling tired, washed out, or achy.  Sleepiness or drowsiness.  Confusion or agitation.  What happens before the procedure? Staying hydrated Follow instructions from your health care provider about hydration, which may include:  Up to 2 hours before the procedure - you may continue to drink clear liquids, such as water, clear fruit juice, black coffee, and plain tea.  Eating and drinking restrictions Follow instructions from your health care provider about eating and drinking, which may include:  8 hours before the procedure - stop eating heavy meals or foods such as meat, fried foods, or fatty foods.  6 hours before the procedure - stop eating light meals or foods, such as toast or cereal.  6 hours before the procedure - stop drinking milk or drinks that contain milk.  2 hours before the procedure - stop drinking clear liquids.  Medicines  Ask your health care provider about: ? Changing or stopping your regular medicines. This is especially important if you are taking diabetes medicines or blood thinners. ? Taking medicines such as aspirin and ibuprofen. These medicines can thin your blood. Do not take these medicines before your procedure if your health care provider instructs you not to. ? Taking new dietary supplements or medicines. Do not take these during the week before your procedure unless your health care provider approves them.  If you are told to take a medicine or to continue taking a medicine on the day of the procedure, take the medicine with sips of water. General instructions   Ask if you will be going home the same day, the following day, or after a longer hospital stay. ? Plan to have someone take you home. ? Plan to have someone stay with you for the  first 24 hours after you leave the hospital or clinic.  For 3-6 weeks before the procedure, try not to use any tobacco products, such as cigarettes, chewing tobacco, and e-cigarettes.  You may brush your teeth on the morning of the procedure, but make sure to spit out the toothpaste. What happens during the procedure?  You will be given anesthetics through a mask and through an IV tube in one of your veins.  You may receive medicine to help you relax (sedative).  As soon as you are asleep, a breathing tube may be used to help you breathe.  An anesthesia specialist will stay with you throughout the procedure. He or she will help keep you comfortable and safe by continuing to give you medicines and adjusting the amount of medicine that you get. He or she will also watch your blood pressure, pulse, and oxygen levels to make sure that the anesthetics do not cause any problems.  If a breathing tube was used to help you breathe, it will be removed before you wake up. The procedure may vary among health care providers and hospitals. What happens after the procedure?  You will wake up, often slowly, after the procedure is complete, usually in a recovery area.  Your blood pressure, heart rate, breathing rate, and blood oxygen level will be monitored until the medicines you were given have worn off.  You may be given medicine to help you calm down if you feel anxious or agitated.  If you will be going home the same day, your health care provider may check to make sure you can stand, drink, and urinate.  Your health care providers will treat your pain and side effects before you go home.  Do not drive for 24 hours if you received a sedative.  You may: ? Feel nauseous and vomit. ? Have a sore throat. ? Have mental slowness. ? Feel cold or shivery. ? Feel sleepy. ? Feel tired. ? Feel sore or achy, even in parts of your body where you did not have surgery. This information is not intended to  replace advice given to you by your health care provider. Make sure you discuss any questions you have with your health care provider. Document Released: 12/11/2007 Document Revised: 02/14/2016 Document Reviewed: 08/18/2015 Elsevier Interactive Patient Education  2018 [employer] General Anesthesia, Adult, Care After These instructions provide you with information about caring for yourself after your procedure. Your health care provider may also give you more specific instructions. Your treatment has been planned according to current medical practices, but problems sometimes occur. Call your health care provider if you have any problems or questions after your procedure. What can I expect after the procedure? After the procedure, it is common to have:  Vomiting.  A sore throat.  Mental slowness.  It is common to feel:  Nauseous.  Cold or shivery.  Sleepy.  Tired.  Sore or achy, even in parts of your body where you did not have surgery.  Follow these instructions at home: For at least 24 hours after the procedure:  Do not: ? Participate in activities where you could fall or become injured. ? Drive. ? Use heavy machinery. ? Drink alcohol. ? Take sleeping pills or medicines that cause drowsiness. ? Make important decisions or sign legal documents. ? Take care of children on your own.  Rest. Eating and drinking  If you vomit, drink water, juice, or soup when you can drink without vomiting.  Drink enough fluid to keep your urine clear or pale yellow.  Make sure you have little or no nausea before eating solid foods.  Follow the diet recommended by your health care provider. General instructions  Have a responsible adult stay with you until you are awake and alert.  Return to your normal activities as told by your health care provider. Ask your health care provider what activities are safe for you.  Take over-the-counter and prescription medicines  only as told by  your health care provider.  If you smoke, do not smoke without supervision.  Keep all follow-up visits as told by your health care provider. This is important. Contact a health care provider if:  You continue to have nausea or vomiting at home, and medicines are not helpful.  You cannot drink fluids or start eating again.  You cannot urinate after 8-12 hours.  You develop a skin rash.  You have fever.  You have increasing redness at the site of your procedure. Get help right away if:  You have difficulty breathing.  You have chest pain.  You have unexpected bleeding.  You feel that you are having a life-threatening or urgent problem. This information is not intended to replace advice given to you by your health care provider. Make sure you discuss any questions you have with your health care provider. Document Released: 12/10/2000 Document Revised: 02/06/2016 Document Reviewed: 08/18/2015 Elsevier Interactive Patient Education  [employer].

## 2018-04-07 ENCOUNTER — Other Ambulatory Visit: Payer: Self-pay

## 2018-04-07 ENCOUNTER — Encounter (HOSPITAL_COMMUNITY): Payer: Self-pay

## 2018-04-07 ENCOUNTER — Encounter (HOSPITAL_COMMUNITY)
Admission: RE | Admit: 2018-04-07 | Discharge: 2018-04-07 | Disposition: A | Discharge status: Home / Self Care | Payer: Medicare PPO | Source: Ambulatory Visit | Attending: General Surgery | Admitting: General Surgery

## 2018-04-07 DIAGNOSIS — F329 Major depressive disorder, single episode, unspecified: Secondary | ICD-10-CM | POA: Diagnosis not present

## 2018-04-07 DIAGNOSIS — Z01812 Encounter for preprocedural laboratory examination: Secondary | ICD-10-CM | POA: Insufficient documentation

## 2018-04-07 DIAGNOSIS — I10 Essential (primary) hypertension: Secondary | ICD-10-CM | POA: Diagnosis not present

## 2018-04-07 DIAGNOSIS — K219 Gastro-esophageal reflux disease without esophagitis: Secondary | ICD-10-CM | POA: Diagnosis not present

## 2018-04-07 DIAGNOSIS — Z01810 Encounter for preprocedural cardiovascular examination: Secondary | ICD-10-CM | POA: Insufficient documentation

## 2018-04-07 DIAGNOSIS — F419 Anxiety disorder, unspecified: Secondary | ICD-10-CM | POA: Insufficient documentation

## 2018-04-07 DIAGNOSIS — M797 Fibromyalgia: Secondary | ICD-10-CM | POA: Diagnosis not present

## 2018-04-07 HISTORY — DX: Gastro-esophageal reflux disease without esophagitis: K21.9

## 2018-04-07 HISTORY — DX: Essential (primary) hypertension: I10

## 2018-04-07 HISTORY — DX: Fibromyalgia: M79.7

## 2018-04-07 HISTORY — DX: Headache, unspecified: R51.9

## 2018-04-07 HISTORY — DX: Major depressive disorder, single episode, unspecified: F32.9

## 2018-04-07 HISTORY — DX: Depression, unspecified: F32.A

## 2018-04-07 HISTORY — DX: Anxiety disorder, unspecified: F41.9

## 2018-04-07 HISTORY — DX: Unspecified osteoarthritis, unspecified site: M19.90

## 2018-04-07 HISTORY — DX: Headache: R51

## 2018-04-07 LAB — COMPREHENSIVE METABOLIC PANEL
ALBUMIN: 3.9 g/dL (ref 3.5–5.0)
ALT: 15 U/L (ref 0–44)
AST: 17 U/L (ref 15–41)
Alkaline Phosphatase: 49 U/L (ref 38–126)
Anion gap: 7 (ref 5–15)
BUN: 10 mg/dL (ref 8–23)
CHLORIDE: 107 mmol/L (ref 98–111)
CO2: 25 mmol/L (ref 22–32)
Calcium: 8.9 mg/dL (ref 8.9–10.3)
Creatinine, Ser: 0.6 mg/dL (ref 0.44–1.00)
GFR calc Af Amer: >60 mL/min (ref >60)
Glucose, Bld: 103 mg/dL — ABNORMAL HIGH (ref 70–99)
POTASSIUM: 3.7 mmol/L (ref 3.5–5.1)
Sodium: 139 mmol/L (ref 135–145)
Total Bilirubin: 0.9 mg/dL (ref 0.3–1.2)
Total Protein: 7.5 g/dL (ref 6.5–8.1)

## 2018-04-07 LAB — CBC WITH DIFFERENTIAL/PLATELET
BASOS ABS: 0 10*3/uL (ref 0.0–0.1)
BASOS PCT: 1 %
EOS PCT: 3 %
Eosinophils Absolute: 0.2 10*3/uL (ref 0.0–0.7)
HCT: 36.3 % (ref 36.0–46.0)
Hemoglobin: 11.9 g/dL — ABNORMAL LOW (ref 12.0–15.0)
Lymphocytes Relative: 33 %
Lymphs Abs: 2 10*3/uL (ref 0.7–4.0)
MCH: 31.4 pg (ref 26.0–34.0)
MCHC: 32.8 g/dL (ref 30.0–36.0)
MCV: 95.8 fL (ref 78.0–100.0)
MONO ABS: 0.6 10*3/uL (ref 0.1–1.0)
Monocytes Relative: 10 %
Neutro Abs: 3.2 10*3/uL (ref 1.7–7.7)
Neutrophils Relative %: 53 %
PLATELETS: 367 10*3/uL (ref 150–400)
RBC: 3.79 MIL/uL — AB (ref 3.87–5.11)
RDW: 12.9 % (ref 11.5–15.5)
WBC: 5.9 10*3/uL (ref 4.0–10.5)

## 2018-04-07 LAB — TYPE AND SCREEN
ABO/RH(D): B POS
Antibody Screen: NEGATIVE

## 2018-04-07 LAB — TSH: TSH: 0.415 u[IU]/mL (ref 0.350–4.500)

## 2018-04-08 LAB — T4: T4 TOTAL: 7.1 ug/dL (ref 4.5–12.0)

## 2018-04-08 LAB — T3: T3, Total: 106 ng/dL (ref 71–180)

## 2018-04-14 ENCOUNTER — Other Ambulatory Visit: Payer: Self-pay

## 2018-04-14 ENCOUNTER — Encounter (HOSPITAL_COMMUNITY): Payer: Self-pay

## 2018-04-14 ENCOUNTER — Ambulatory Visit (HOSPITAL_COMMUNITY): Payer: Medicare PPO | Admitting: Anesthesiology

## 2018-04-14 ENCOUNTER — Observation Stay (HOSPITAL_COMMUNITY)
Admission: RE | Admit: 2018-04-14 | Discharge: 2018-04-15 | Disposition: A | Discharge status: Home / Self Care | Payer: Medicare PPO | Source: Ambulatory Visit | Attending: General Surgery | Admitting: General Surgery

## 2018-04-14 ENCOUNTER — Encounter (HOSPITAL_COMMUNITY)
Admission: RE | Disposition: A | Discharge status: Home / Self Care | Payer: Self-pay | Source: Ambulatory Visit | Attending: General Surgery

## 2018-04-14 DIAGNOSIS — Z79899 Other long term (current) drug therapy: Secondary | ICD-10-CM | POA: Insufficient documentation

## 2018-04-14 DIAGNOSIS — I10 Essential (primary) hypertension: Secondary | ICD-10-CM | POA: Diagnosis not present

## 2018-04-14 DIAGNOSIS — F419 Anxiety disorder, unspecified: Secondary | ICD-10-CM | POA: Insufficient documentation

## 2018-04-14 DIAGNOSIS — Z7982 Long term (current) use of aspirin: Secondary | ICD-10-CM | POA: Insufficient documentation

## 2018-04-14 DIAGNOSIS — F329 Major depressive disorder, single episode, unspecified: Secondary | ICD-10-CM | POA: Diagnosis not present

## 2018-04-14 DIAGNOSIS — K219 Gastro-esophageal reflux disease without esophagitis: Secondary | ICD-10-CM | POA: Diagnosis not present

## 2018-04-14 DIAGNOSIS — E042 Nontoxic multinodular goiter: Principal | ICD-10-CM | POA: Insufficient documentation

## 2018-04-14 DIAGNOSIS — E890 Postprocedural hypothyroidism: Secondary | ICD-10-CM

## 2018-04-14 HISTORY — DX: Thyrotoxicosis, unspecified without thyrotoxic crisis or storm: E05.90

## 2018-04-14 HISTORY — PX: THYROIDECTOMY: SHX17

## 2018-04-14 LAB — COMPREHENSIVE METABOLIC PANEL
ALT: 13 U/L (ref 0–44)
AST: 16 U/L (ref 15–41)
Albumin: 3.7 g/dL (ref 3.5–5.0)
Alkaline Phosphatase: 55 U/L (ref 38–126)
Anion gap: 6 (ref 5–15)
BUN: 9 mg/dL (ref 8–23)
CALCIUM: 8.4 mg/dL — AB (ref 8.9–10.3)
CO2: 27 mmol/L (ref 22–32)
CREATININE: 0.53 mg/dL (ref 0.44–1.00)
Chloride: 107 mmol/L (ref 98–111)
Glucose, Bld: 81 mg/dL (ref 70–99)
Potassium: 4.1 mmol/L (ref 3.5–5.1)
Sodium: 140 mmol/L (ref 135–145)
Total Bilirubin: 0.9 mg/dL (ref 0.3–1.2)
Total Protein: 7.2 g/dL (ref 6.5–8.1)

## 2018-04-14 SURGERY — THYROIDECTOMY
Anesthesia: General | Site: Neck

## 2018-04-14 MED ORDER — ROCURONIUM BROMIDE 100 MG/10ML IV SOLN
INTRAVENOUS | Status: DC | PRN
  Administered 2018-04-14: 15 mg via INTRAVENOUS
  Administered 2018-04-14: 5 mg via INTRAVENOUS

## 2018-04-14 MED ORDER — DILTIAZEM HCL ER COATED BEADS 180 MG PO CP24
180.0000 mg | ORAL_CAPSULE | Freq: Every day | ORAL | Status: DC
  Administered 2018-04-15: 180 mg via ORAL
  Filled 2018-04-14: qty 1

## 2018-04-14 MED ORDER — HYDROMORPHONE HCL 1 MG/ML IJ SOLN
1.0000 mg | INTRAMUSCULAR | Status: DC | PRN
  Administered 2018-04-14: 1 mg via INTRAVENOUS
  Filled 2018-04-14: qty 1

## 2018-04-14 MED ORDER — ROCURONIUM BROMIDE 50 MG/5ML IV SOLN
INTRAVENOUS | Status: AC
  Filled 2018-04-14: qty 1

## 2018-04-14 MED ORDER — MIDAZOLAM HCL 2 MG/2ML IJ SOLN
INTRAMUSCULAR | Status: AC
  Filled 2018-04-14: qty 2

## 2018-04-14 MED ORDER — PROPOFOL 10 MG/ML IV BOLUS
INTRAVENOUS | Status: DC | PRN
  Administered 2018-04-14: 130 mg via INTRAVENOUS

## 2018-04-14 MED ORDER — ATORVASTATIN CALCIUM 10 MG PO TABS
10.0000 mg | ORAL_TABLET | Freq: Every day | ORAL | Status: DC
  Administered 2018-04-15: 10 mg via ORAL
  Filled 2018-04-14: qty 1

## 2018-04-14 MED ORDER — KETOROLAC TROMETHAMINE 30 MG/ML IJ SOLN
30.0000 mg | Freq: Four times a day (QID) | INTRAMUSCULAR | Status: DC | PRN
  Administered 2018-04-14: 30 mg via INTRAVENOUS
  Filled 2018-04-14: qty 1

## 2018-04-14 MED ORDER — CHLORHEXIDINE GLUCONATE CLOTH 2 % EX PADS: 6.0000 | MEDICATED_PAD | Freq: Once | CUTANEOUS | Status: DC

## 2018-04-14 MED ORDER — MENTHOL 3 MG MT LOZG: 1.0000 | LOZENGE | OROMUCOSAL | Status: DC | PRN

## 2018-04-14 MED ORDER — CEFAZOLIN SODIUM-DEXTROSE 2-4 GM/100ML-% IV SOLN
2.0000 g | INTRAVENOUS | Status: AC
  Administered 2018-04-14: 2 g via INTRAVENOUS

## 2018-04-14 MED ORDER — FOLIC ACID 1 MG PO TABS
1.0000 mg | ORAL_TABLET | Freq: Every day | ORAL | Status: DC
  Administered 2018-04-15: 1 mg via ORAL
  Filled 2018-04-14: qty 1

## 2018-04-14 MED ORDER — HEMOSTATIC AGENTS (NO CHARGE) OPTIME
TOPICAL | Status: DC | PRN
  Administered 2018-04-14: 2 via TOPICAL

## 2018-04-14 MED ORDER — SUCCINYLCHOLINE CHLORIDE 20 MG/ML IJ SOLN
INTRAMUSCULAR | Status: DC | PRN
  Administered 2018-04-14: 120 mg via INTRAVENOUS

## 2018-04-14 MED ORDER — HYDROCODONE-ACETAMINOPHEN 7.5-325 MG PO TABS: 1.0000 | ORAL_TABLET | Freq: Once | ORAL | Status: DC | PRN

## 2018-04-14 MED ORDER — DIPHENHYDRAMINE HCL 25 MG PO CAPS: 25.0000 mg | ORAL_CAPSULE | Freq: Four times a day (QID) | ORAL | Status: DC | PRN

## 2018-04-14 MED ORDER — SUGAMMADEX SODIUM 200 MG/2ML IV SOLN
INTRAVENOUS | Status: AC
  Filled 2018-04-14: qty 2

## 2018-04-14 MED ORDER — ACETAMINOPHEN 325 MG PO TABS: 650.0000 mg | ORAL_TABLET | Freq: Four times a day (QID) | ORAL | Status: DC | PRN

## 2018-04-14 MED ORDER — KETOROLAC TROMETHAMINE 30 MG/ML IJ SOLN
INTRAMUSCULAR | Status: AC
  Filled 2018-04-14: qty 1

## 2018-04-14 MED ORDER — ALPRAZOLAM 0.5 MG PO TABS: 0.5000 mg | ORAL_TABLET | Freq: Three times a day (TID) | ORAL | Status: DC | PRN

## 2018-04-14 MED ORDER — HYDROMORPHONE HCL 1 MG/ML IJ SOLN
0.2500 mg | INTRAMUSCULAR | Status: DC | PRN
  Administered 2018-04-14 (×3): 0.5 mg via INTRAVENOUS
  Filled 2018-04-14 (×3): qty 0.5

## 2018-04-14 MED ORDER — HYDRALAZINE HCL 25 MG PO TABS
100.0000 mg | ORAL_TABLET | Freq: Three times a day (TID) | ORAL | Status: DC
  Administered 2018-04-14 – 2018-04-15 (×3): 100 mg via ORAL
  Filled 2018-04-14 (×3): qty 4

## 2018-04-14 MED ORDER — TIZANIDINE HCL 4 MG PO TABS
4.0000 mg | ORAL_TABLET | Freq: Every day | ORAL | Status: DC
  Administered 2018-04-15: 4 mg via ORAL
  Filled 2018-04-14: qty 1

## 2018-04-14 MED ORDER — BUPIVACAINE LIPOSOME 1.3 % IJ SUSP
INTRAMUSCULAR | Status: DC | PRN
  Administered 2018-04-14: 4 mL

## 2018-04-14 MED ORDER — SUCCINYLCHOLINE CHLORIDE 20 MG/ML IJ SOLN
INTRAMUSCULAR | Status: AC
  Filled 2018-04-14: qty 1

## 2018-04-14 MED ORDER — LEVOTHYROXINE SODIUM 100 MCG PO TABS
100.0000 ug | ORAL_TABLET | Freq: Every day | ORAL | Status: DC
  Administered 2018-04-15: 100 ug via ORAL
  Filled 2018-04-14: qty 1

## 2018-04-14 MED ORDER — DIPHENHYDRAMINE HCL 50 MG/ML IJ SOLN
25.0000 mg | Freq: Four times a day (QID) | INTRAMUSCULAR | Status: DC | PRN
  Administered 2018-04-14: 25 mg via INTRAVENOUS
  Filled 2018-04-14: qty 1

## 2018-04-14 MED ORDER — DULOXETINE HCL 60 MG PO CPEP
60.0000 mg | ORAL_CAPSULE | Freq: Two times a day (BID) | ORAL | Status: DC
  Administered 2018-04-14 – 2018-04-15 (×2): 60 mg via ORAL
  Filled 2018-04-14 (×2): qty 1

## 2018-04-14 MED ORDER — BUPIVACAINE LIPOSOME 1.3 % IJ SUSP
INTRAMUSCULAR | Status: AC
  Filled 2018-04-14: qty 20

## 2018-04-14 MED ORDER — CEFAZOLIN SODIUM-DEXTROSE 2-4 GM/100ML-% IV SOLN
INTRAVENOUS | Status: AC
  Filled 2018-04-14: qty 100

## 2018-04-14 MED ORDER — ENOXAPARIN SODIUM 40 MG/0.4ML ~~LOC~~ SOLN
40.0000 mg | SUBCUTANEOUS | Status: DC
  Administered 2018-04-15: 40 mg via SUBCUTANEOUS
  Filled 2018-04-14: qty 0.4

## 2018-04-14 MED ORDER — SODIUM CHLORIDE 0.9 % IR SOLN
Status: DC | PRN
  Administered 2018-04-14: 1000 mL

## 2018-04-14 MED ORDER — PROPRANOLOL HCL 20 MG PO TABS
60.0000 mg | ORAL_TABLET | Freq: Two times a day (BID) | ORAL | Status: DC
  Administered 2018-04-14 – 2018-04-15 (×2): 60 mg via ORAL
  Filled 2018-04-14 (×2): qty 3

## 2018-04-14 MED ORDER — EPHEDRINE SULFATE 50 MG/ML IJ SOLN
INTRAMUSCULAR | Status: DC | PRN
  Administered 2018-04-14: 10 mg via INTRAVENOUS

## 2018-04-14 MED ORDER — SIMETHICONE 80 MG PO CHEW: 40.0000 mg | CHEWABLE_TABLET | Freq: Four times a day (QID) | ORAL | Status: DC | PRN

## 2018-04-14 MED ORDER — ACETAMINOPHEN 650 MG RE SUPP
650.0000 mg | Freq: Four times a day (QID) | RECTAL | Status: DC | PRN
Start: 2018-04-14 — End: 2018-04-15

## 2018-04-14 MED ORDER — MEPERIDINE HCL 50 MG/ML IJ SOLN: 6.2500 mg | INTRAMUSCULAR | Status: DC | PRN

## 2018-04-14 MED ORDER — PROMETHAZINE HCL 25 MG/ML IJ SOLN: 6.2500 mg | INTRAMUSCULAR | Status: DC | PRN

## 2018-04-14 MED ORDER — KETOROLAC TROMETHAMINE 30 MG/ML IJ SOLN
30.0000 mg | Freq: Four times a day (QID) | INTRAMUSCULAR | Status: AC
  Administered 2018-04-14: 30 mg via INTRAVENOUS

## 2018-04-14 MED ORDER — LACTATED RINGERS IV SOLN
INTRAVENOUS | Status: DC
  Administered 2018-04-14 (×2): via INTRAVENOUS

## 2018-04-14 MED ORDER — SUGAMMADEX SODIUM 200 MG/2ML IV SOLN
INTRAVENOUS | Status: DC | PRN
  Administered 2018-04-14: 150 mg via INTRAVENOUS

## 2018-04-14 MED ORDER — ONDANSETRON HCL 4 MG/2ML IJ SOLN: 4.0000 mg | Freq: Four times a day (QID) | INTRAMUSCULAR | Status: DC | PRN

## 2018-04-14 MED ORDER — MIDAZOLAM HCL 5 MG/5ML IJ SOLN
INTRAMUSCULAR | Status: DC | PRN
  Administered 2018-04-14 (×2): 1 mg via INTRAVENOUS

## 2018-04-14 MED ORDER — CALCIUM CARBONATE-VITAMIN D 500-200 MG-UNIT PO TABS
1.0000 | ORAL_TABLET | Freq: Two times a day (BID) | ORAL | Status: DC
  Administered 2018-04-14 – 2018-04-15 (×2): 1 via ORAL
  Filled 2018-04-14 (×2): qty 1

## 2018-04-14 MED ORDER — LACTATED RINGERS IV SOLN: INTRAVENOUS | Status: DC

## 2018-04-14 MED ORDER — FENTANYL CITRATE (PF) 100 MCG/2ML IJ SOLN
INTRAMUSCULAR | Status: DC | PRN
  Administered 2018-04-14 (×2): 50 ug via INTRAVENOUS

## 2018-04-14 MED ORDER — ONDANSETRON HCL 4 MG/2ML IJ SOLN
INTRAMUSCULAR | Status: DC | PRN
  Administered 2018-04-14: 4 mg via INTRAVENOUS

## 2018-04-14 MED ORDER — ONDANSETRON 4 MG PO TBDP: 4.0000 mg | ORAL_TABLET | Freq: Four times a day (QID) | ORAL | Status: DC | PRN

## 2018-04-14 MED ORDER — FAMOTIDINE 20 MG PO TABS
20.0000 mg | ORAL_TABLET | Freq: Two times a day (BID) | ORAL | Status: DC
  Administered 2018-04-14 – 2018-04-15 (×2): 20 mg via ORAL
  Filled 2018-04-14 (×2): qty 1

## 2018-04-14 MED ORDER — LACTATED RINGERS IV SOLN
INTRAVENOUS | Status: DC
  Administered 2018-04-14 (×2): via INTRAVENOUS

## 2018-04-14 MED ORDER — HYDROCODONE-ACETAMINOPHEN 5-325 MG PO TABS
1.0000 | ORAL_TABLET | ORAL | Status: DC | PRN
  Administered 2018-04-15 (×2): 2 via ORAL
  Filled 2018-04-14 (×2): qty 2

## 2018-04-14 MED ORDER — FENTANYL CITRATE (PF) 100 MCG/2ML IJ SOLN
INTRAMUSCULAR | Status: AC
  Filled 2018-04-14: qty 2

## 2018-04-14 SURGICAL SUPPLY — 53 items
APPLIER CLIP 11 MED OPEN (CLIP)
APPLIER CLIP 9.375 SM OPEN (CLIP) ×3
ATTRACTOMAT 16X20 MAGNETIC DRP (DRAPES) ×3 IMPLANT
BLADE SURG 15 STRL LF DISP TIS (BLADE) ×1 IMPLANT
BLADE SURG 15 STRL SS (BLADE) ×2
BLADE SURG SZ10 CARB STEEL (BLADE) ×3 IMPLANT
CHLORAPREP W/TINT 10.5 ML (MISCELLANEOUS) ×3 IMPLANT
CLIP APPLIE 11 MED OPEN (CLIP) IMPLANT
CLIP APPLIE 9.375 SM OPEN (CLIP) ×1 IMPLANT
CLOTH BEACON ORANGE TIMEOUT ST (SAFETY) ×3 IMPLANT
COVER LIGHT HANDLE STERIS (MISCELLANEOUS) ×6 IMPLANT
DERMABOND ADVANCED (GAUZE/BANDAGES/DRESSINGS) ×2
DERMABOND ADVANCED .7 DNX12 (GAUZE/BANDAGES/DRESSINGS) ×1 IMPLANT
DRAPE LAPAROTOMY 77X122 PED (DRAPES) ×3 IMPLANT
DRAPE PROXIMA HALF (DRAPES) ×6 IMPLANT
ELECT NEEDLE TIP 2.8 STRL (NEEDLE) ×3 IMPLANT
ELECT REM PT RETURN 9FT ADLT (ELECTROSURGICAL) ×3
ELECTRODE REM PT RTRN 9FT ADLT (ELECTROSURGICAL) ×1 IMPLANT
GAUZE SPONGE 4X4 16PLY XRAY LF (GAUZE/BANDAGES/DRESSINGS) ×6 IMPLANT
GLOVE BIOGEL PI IND STRL 7.0 (GLOVE) ×2 IMPLANT
GLOVE BIOGEL PI INDICATOR 7.0 (GLOVE) ×4
GLOVE SURG SS PI 7.5 STRL IVOR (GLOVE) ×3 IMPLANT
GOWN STRL REUS W/ TWL LRG LVL3 (GOWN DISPOSABLE) ×2 IMPLANT
GOWN STRL REUS W/TWL LRG LVL3 (GOWN DISPOSABLE) ×7 IMPLANT
HEMOSTAT ARISTA ABSORB 1G (MISCELLANEOUS) ×3 IMPLANT
HEMOSTAT SURGICEL 4X8 (HEMOSTASIS) ×3 IMPLANT
KIT BLADEGUARD II DBL (SET/KITS/TRAYS/PACK) ×3 IMPLANT
KIT TURNOVER KIT A (KITS) ×3 IMPLANT
MANIFOLD NEPTUNE II (INSTRUMENTS) ×3 IMPLANT
MARKER SKIN DUAL TIP RULER LAB (MISCELLANEOUS) ×3 IMPLANT
NEEDLE HYPO 21X1.5 SAFETY (NEEDLE) ×3 IMPLANT
NS IRRIG 1000ML POUR BTL (IV SOLUTION) ×3 IMPLANT
PACK BASIC III (CUSTOM PROCEDURE TRAY) ×2
PACK SRG BSC III STRL LF ECLPS (CUSTOM PROCEDURE TRAY) ×1 IMPLANT
PAD ARMBOARD 7.5X6 YLW CONV (MISCELLANEOUS) ×3 IMPLANT
PENCIL HANDSWITCHING (ELECTRODE) ×3 IMPLANT
SET BASIN LINEN APH (SET/KITS/TRAYS/PACK) ×3 IMPLANT
SHEARS HARMONIC 9CM CVD (BLADE) ×3 IMPLANT
SPONGE DRAIN TRACH 4X4 STRL 2S (GAUZE/BANDAGES/DRESSINGS) ×3 IMPLANT
SPONGE INTESTINAL PEANUT (DISPOSABLE) ×9 IMPLANT
STAPLER VISISTAT 35W (STAPLE) ×3 IMPLANT
SUT ETHILON 4 0 PS 2 18 (SUTURE) IMPLANT
SUT MNCRL AB 4-0 PS2 18 (SUTURE) ×3 IMPLANT
SUT SILK 2 0 (SUTURE) ×2
SUT SILK 2-0 18XBRD TIE 12 (SUTURE) ×1 IMPLANT
SUT SILK 3 0 (SUTURE) ×2
SUT SILK 3-0 18XBRD TIE 12 (SUTURE) ×1 IMPLANT
SUT VIC AB 2-0 CT2 27 (SUTURE) ×3 IMPLANT
SUT VIC AB 3-0 SH 27 (SUTURE) ×2
SUT VIC AB 3-0 SH 27X BRD (SUTURE) ×1 IMPLANT
SYR 20CC LL (SYRINGE) ×3 IMPLANT
TOWEL OR 17X26 4PK STRL BLUE (TOWEL DISPOSABLE) ×3 IMPLANT
YANKAUER SUCT BULB TIP 10FT TU (MISCELLANEOUS) ×3 IMPLANT

## 2018-04-14 NOTE — Anesthesia Postprocedure Evaluation (Signed)
Anesthesia Post Note  Patient: Bridget Woods  Procedure(s) Performed: TOTAL THYROIDECTOMY (N/A Neck)  Patient location during evaluation: PACU Anesthesia Type: General Level of consciousness: awake and patient cooperative Pain management: pain level controlled Vital Signs Assessment: post-procedure vital signs reviewed and stable Respiratory status: spontaneous breathing, nonlabored ventilation, respiratory function stable and patient connected to tracheostomy mask oxygen Cardiovascular status: blood pressure returned to baseline Postop Assessment: no apparent nausea or vomiting Anesthetic complications: no     Last Vitals:  Vitals:   04/14/18 0900  BP: 137/76  Pulse: 62  Resp: (!) 21  Temp: 36.9 C  SpO2: 99%    Last Pain:  Vitals:   04/14/18 0900  TempSrc: Oral  PainSc: 3                  Meagan Ancona J

## 2018-04-14 NOTE — Progress Notes (Signed)
This note also relates to the following rows which could not be included: Pulse Rate - Cannot attach notes to unvalidated device data BP - Cannot attach notes to unvalidated device data SpO2 - Cannot attach notes to unvalidated device data

## 2018-04-14 NOTE — Anesthesia Postprocedure Evaluation (Signed)
Anesthesia Post Note  Patient: Bridget Woods  Procedure(s) Performed: TOTAL THYROIDECTOMY (N/A Neck)  Patient location during evaluation: PACU Anesthesia Type: General Level of consciousness: awake and alert and patient cooperative Pain management: satisfactory to patient Vital Signs Assessment: post-procedure vital signs reviewed and stable Respiratory status: spontaneous breathing Cardiovascular status: stable Postop Assessment: no apparent nausea or vomiting Anesthetic complications: no     Last Vitals:  Vitals:   04/14/18 1230 04/14/18 1245  BP: (!) 157/75 (!) 142/73  Pulse: 66 (!) 59  Resp: 13 10  Temp:    SpO2: 99% 97%    Last Pain:  Vitals:   04/14/18 1245  TempSrc:   PainSc: Asleep                 Ynez Eugenio

## 2018-04-14 NOTE — Anesthesia Procedure Notes (Signed)
Procedure Name: Intubation Date/Time: 04/14/2018 10:31 AM Performed by: Vista Deck, CRNA Pre-anesthesia Checklist: Patient identified, Patient being monitored, Timeout performed, Emergency Drugs available and Suction available Patient Re-evaluated:Patient Re-evaluated prior to induction Oxygen Delivery Method: Circle System Utilized Preoxygenation: Pre-oxygenation with 100% oxygen Induction Type: IV induction Ventilation: Mask ventilation without difficulty Laryngoscope Size: Mac and 3 Grade View: Grade II Tube type: Oral Tube size: 7.0 mm Number of attempts: 1 Airway Equipment and Method: stylet Placement Confirmation: ETT inserted through vocal cords under direct vision,  positive ETCO2 and breath sounds checked- equal and bilateral Secured at: 21 cm Tube secured with: Tape Dental Injury: Teeth and Oropharynx as per pre-operative assessment

## 2018-04-14 NOTE — Transfer of Care (Signed)
Immediate Anesthesia Transfer of Care Note  Patient: Bridget Woods  Procedure(s) Performed: TOTAL THYROIDECTOMY (N/A Neck)  Patient Location: PACU  Anesthesia Type:General  Level of Consciousness: awake and patient cooperative  Airway & Oxygen Therapy: Patient Spontanous Breathing and Patient connected to tracheostomy mask oxygen  Post-op Assessment: Report given to RN, Post -op Vital signs reviewed and stable and Patient moving all extremities  Post vital signs: Reviewed and stable  Last Vitals:  Vitals Value Taken Time  BP 171/88 04/14/2018 12:06 PM  Temp    Pulse 66 04/14/2018 12:08 PM  Resp 20 04/14/2018 12:08 PM  SpO2 98 % 04/14/2018 12:08 PM  Vitals shown include unvalidated device data.  Last Pain:  Vitals:   04/14/18 0900  TempSrc: Oral  PainSc: 3       Patients Stated Pain Goal: 7 (04/14/18 0900)  Complications: No apparent anesthesia complications

## 2018-04-14 NOTE — Op Note (Signed)
Patient:  Bridget Woods  DOB:  04/19/1956  MRN:  [id number]   Preop Diagnosis: Multinodular goiter  Postop Diagnosis: Same  Procedure: Total thyroidectomy  Surgeon: Franky MachoMark Kinzly Pierrelouis, MD  Anes: General tracheal  Indications: Patient is a 62 year old black female who presents with compressive symptoms from my multinodular goiter.  The risks and benefits of the procedure including bleeding, infection, voice changes, and nerve injury were fully explained to the patient, who gave informed consent.  Procedure note: The patient was placed in supine position.  After induction of general endotracheal anesthesia, the neck was prepped and draped using the usual sterile technique with DuraPrep.  Surgical site confirmation was performed.  Transverse incision was made just above the jugular notch.  The platysma was divided without difficulty.  The superior flap was formed to the larynx and an inferior flap performed to the jugular notch.  The strap muscle was divided longitudinally.  Then I bluntly exposed the left lobe of the thyroid gland which was enlarged.  The inferior thyroidal artery and vein, middle thyroidal vein, and suspensory ligament of Berry were all ligated with small clips and divided using the harmonic scalpel.  Care was taken to avoid the recurrent laryngeal nerve.  The thyroid gland was then rotated medially.  It was dissected off the trachea using the harmonic scalpel.  The right lobe was smaller.  The inferior thyroidal artery and vein, middle thyroidal vein, and suspensory ligament of Berry were all ligated divided using small clips and the harmonic scalpel.  The specimen was then removed from the operative field.  A suture was placed in the left lobe of the thyroid gland orientation purposes.  The thyroid beds were inspected.  All parathyroid glands were noted to be intact.  There was no apparent injury to the recurrent laryngeal nerves.  Arista and Surgicel were placed into the thyroid  beds.  The strap muscle was reapproximated longitudinally using a 2-0 Vicryl running suture.  The platysma was reapproximated using a 3-0 Vicryl running suture.  Exparel was instilled into the surrounding wound.  The skin was closed using a 4-0 Monocryl subcuticular suture.  Dermabond was applied.  All tape needle counts were correct at the end of the procedure.  The patient was extubated in the operating room and transferred to the PACU in stable condition.  The patient was able to phonate the letter E without difficulty.  Complications: None  EBL: 25 cc  Specimen: Thyroid gland

## 2018-04-14 NOTE — Anesthesia Preprocedure Evaluation (Signed)
Anesthesia Evaluation  Patient identified by MRN, date of birth, ID band Patient awake    Reviewed: Allergy & Precautions, H&P , NPO status , Patient's Chart, lab work & pertinent test results, reviewed documented beta blocker date and time   Airway Mallampati: II  TM Distance: >3 FB Neck ROM: full    Dental no notable dental hx. (+) Dental Advidsory Given   Pulmonary neg pulmonary ROS,    Pulmonary exam normal breath sounds clear to auscultation       Cardiovascular Exercise Tolerance: Good hypertension, negative cardio ROS   Rhythm:regular Rate:Normal     Neuro/Psych  Headaches, Anxiety Depression  Neuromuscular disease negative neurological ROS  negative psych ROS   GI/Hepatic negative GI ROS, Neg liver ROS, GERD  ,  Endo/Other  negative endocrine ROSHyperthyroidism   Renal/GU negative Renal ROS  negative genitourinary   Musculoskeletal   Abdominal   Peds  Hematology negative hematology ROS (+)   Anesthesia Other Findings Typically takes "two xanax" qHS  Reproductive/Obstetrics negative OB ROS                             Anesthesia Physical Anesthesia Plan  ASA: III  Anesthesia Plan: General   Post-op Pain Management:    Induction:   PONV Risk Score and Plan:   Airway Management Planned:   Additional Equipment:   Intra-op Plan:   Post-operative Plan:   Informed Consent: I have reviewed the patients History and Physical, chart, labs and discussed the procedure including the risks, benefits and alternatives for the proposed anesthesia with the patient or authorized representative who has indicated his/her understanding and acceptance.   Dental Advisory Given  Plan Discussed with: CRNA and Anesthesiologist  Anesthesia Plan Comments:         Anesthesia Quick Evaluation

## 2018-04-14 NOTE — Interval H&P Note (Signed)
History and Physical Interval Note:  04/14/2018 9:39 AM  Bridget Woods  has presented today for surgery, with the diagnosis of multinodular goiter  The various methods of treatment have been discussed with the patient and family. After consideration of risks, benefits and other options for treatment, the patient has consented to  Procedure(s): TOTAL THYROIDECTOMY (N/A) as a surgical intervention .  The patient's history has been reviewed, patient examined, no change in status, stable for surgery.  I have reviewed the patient's chart and labs.  Questions were answered to the patient's satisfaction.     Franky MachoMark Tadan Shill

## 2018-04-15 ENCOUNTER — Encounter (HOSPITAL_COMMUNITY): Payer: Self-pay | Admitting: General Surgery

## 2018-04-15 DIAGNOSIS — E042 Nontoxic multinodular goiter: Secondary | ICD-10-CM | POA: Diagnosis not present

## 2018-04-15 LAB — CBC
HEMATOCRIT: 36.2 % (ref 36.0–46.0)
HEMOGLOBIN: 11.4 g/dL — AB (ref 12.0–15.0)
MCH: 30.6 pg (ref 26.0–34.0)
MCHC: 31.5 g/dL (ref 30.0–36.0)
MCV: 97.3 fL (ref 78.0–100.0)
Platelets: 377 10*3/uL (ref 150–400)
RBC: 3.72 MIL/uL — AB (ref 3.87–5.11)
RDW: 12.9 % (ref 11.5–15.5)
WBC: 9.7 10*3/uL (ref 4.0–10.5)

## 2018-04-15 LAB — COMPREHENSIVE METABOLIC PANEL
ALBUMIN: 3.4 g/dL — AB (ref 3.5–5.0)
ALT: 12 U/L (ref 0–44)
ANION GAP: 12 (ref 5–15)
AST: 15 U/L (ref 15–41)
Alkaline Phosphatase: 51 U/L (ref 38–126)
BUN: 13 mg/dL (ref 8–23)
CHLORIDE: 107 mmol/L (ref 98–111)
CO2: 21 mmol/L — AB (ref 22–32)
Calcium: 8.4 mg/dL — ABNORMAL LOW (ref 8.9–10.3)
Creatinine, Ser: 0.64 mg/dL (ref 0.44–1.00)
GFR calc non Af Amer: >60 mL/min (ref >60)
Glucose, Bld: 73 mg/dL (ref 70–99)
POTASSIUM: 3.2 mmol/L — AB (ref 3.5–5.1)
SODIUM: 140 mmol/L (ref 135–145)
Total Bilirubin: 1.3 mg/dL — ABNORMAL HIGH (ref 0.3–1.2)
Total Protein: 7 g/dL (ref 6.5–8.1)

## 2018-04-15 MED ORDER — HYDROCODONE-ACETAMINOPHEN 5-325 MG PO TABS: 1.0000 | ORAL_TABLET | ORAL | 0 refills | Status: AC | PRN

## 2018-04-15 MED ORDER — LEVOTHYROXINE SODIUM 100 MCG PO TABS: 100.0000 ug | ORAL_TABLET | Freq: Every day | ORAL | 2 refills | Status: DC

## 2018-04-15 NOTE — Anesthesia Postprocedure Evaluation (Signed)
Anesthesia Post Note  Patient: Bridget Woods  Procedure(s) Performed: TOTAL THYROIDECTOMY (N/A Neck)  Patient location during evaluation: Nursing Unit Anesthesia Type: General Level of consciousness: awake and alert and patient cooperative Pain management: pain level controlled Vital Signs Assessment: post-procedure vital signs reviewed and stable Respiratory status: spontaneous breathing, nonlabored ventilation and respiratory function stable Cardiovascular status: blood pressure returned to baseline Postop Assessment: no apparent nausea or vomiting Anesthetic complications: no     Last Vitals:  Vitals:   04/15/18 0204 04/15/18 0554  BP: 134/66 (!) 132/57  Pulse: 86 75  Resp: 15 15  Temp: 37 C 36.8 C  SpO2: 97% 98%    Last Pain:  Vitals:   04/15/18 0838  TempSrc:   PainSc: 4                  Jalin Erpelding J

## 2018-04-15 NOTE — Discharge Summary (Signed)
Physician Discharge Summary  Patient ID: Bridget Woods MRN: [id number] DOB/AGE: 10-17-1955 62 y.o.  Admit date: 04/14/2018 Discharge date: 04/15/2018  Admission Diagnoses: Multinodular goiter  Discharge Diagnoses: Same Active Problems:   Nontoxic multinodular goiter   S/P total thyroidectomy Hypertension  Discharged Condition: good  Hospital Course: Patient is a 62 year old black female with compressive symptoms from my multinodular goiter who underwent total thyroidectomy on 04/14/2018.  She tolerated the surgery well.  Her postoperative course was unremarkable.  Her diet was advanced without difficulty.  She did not develop any significant hypocalcemia.  She had no significant voice changes.  She is being discharged home on 04/15/2018 in good and improving condition.  Treatments: surgery: Total thyroidectomy on 04/14/2018  Discharge Exam: Blood pressure (!) 132/57, pulse 75, temperature 98.2 F (36.8 C), temperature source Oral, resp. rate 15, height 5\' 2"  (1.575 m), weight 142 lb (64.4 kg), SpO2 98 %. General appearance: alert, cooperative and no distress Neck: Incision healing well without significant ecchymosis or hematoma. Resp: clear to auscultation bilaterally Cardio: regular rate and rhythm, S1, S2 normal, no murmur, click, rub or gallop  Disposition: Discharge disposition: 01-Home or Self Care       Discharge Instructions    Diet - low sodium heart healthy   Complete by:  As directed    Increase activity slowly   Complete by:  As directed      Allergies as of 04/15/2018      Reactions   Naproxen Other (See Comments)   Contact Dermatitis--oral blisters   Ciprofibrate Nausea Only      Medication List    STOP taking these medications   HYDROcodone-acetaminophen 10-325 MG tablet Commonly known as:  NORCO Replaced by:  HYDROcodone-acetaminophen 5-325 MG tablet     TAKE these medications   alendronate 70 MG tablet Commonly known as:  FOSAMAX Take 70 mg by  mouth every Sunday.   ALPRAZolam 0.5 MG tablet Commonly known as:  XANAX Take 0.5 mg by mouth 3 (three) times daily as needed for anxiety.   aspirin EC 81 MG tablet Take 81 mg by mouth at bedtime.   aspirin-acetaminophen-caffeine 250-250-65 MG tablet Commonly known as:  EXCEDRIN MIGRAINE Take 2 tablets by mouth 2 (two) times daily as needed for headache or migraine.   atorvastatin 10 MG tablet Commonly known as:  LIPITOR Take 10 mg by mouth daily.   BELSOMRA 10 MG Tabs Generic drug:  Suvorexant Take 10 mg by mouth at bedtime.   calcium-vitamin D 500-200 MG-UNIT tablet Commonly known as:  OSCAL WITH D Take 1 tablet by mouth 2 (two) times daily.   diltiazem 180 MG 24 hr capsule Commonly known as:  CARDIZEM CD Take 180 mg by mouth daily.   DULoxetine 60 MG capsule Commonly known as:  CYMBALTA Take 60 mg by mouth 2 (two) times daily.   folic acid 1 MG tablet Commonly known as:  FOLVITE Take 1 mg by mouth daily.   hydrALAZINE 100 MG tablet Commonly known as:  APRESOLINE Take 100 mg by mouth 3 (three) times daily.   HYDROcodone-acetaminophen 5-325 MG tablet Commonly known as:  NORCO Take 1 tablet by mouth every 4 (four) hours as needed for moderate pain. Replaces:  HYDROcodone-acetaminophen 10-325 MG tablet   levothyroxine 100 MCG tablet Commonly known as:  SYNTHROID, LEVOTHROID Take 1 tablet (100 mcg total) by mouth daily before breakfast.   MOBIC 15 MG tablet Generic drug:  meloxicam Take 15 mg by mouth daily.   potassium chloride  SA 20 MEQ tablet Commonly known as:  K-DUR,KLOR-CON Take 20 mEq by mouth 2 (two) times daily.   predniSONE 5 MG tablet Commonly known as:  DELTASONE Take 5 mg by mouth daily as needed. For rheumatoid arthritis flare ups   propranolol 40 MG tablet Commonly known as:  INDERAL Take 60 mg by mouth 2 (two) times daily.   ranitidine 150 MG tablet Commonly known as:  ZANTAC Take 150 mg by mouth 2 (two) times daily.   tiZANidine 4  MG tablet Commonly known as:  ZANAFLEX Take 4 mg by mouth See admin instructions. Take 1 tablet (4 mg) in the morning & 2 tablets (8 mg) at bedtime.      Follow-up Information    Franky MachoJenkins, Jeter Tomey, MD. Schedule an appointment as soon as possible for a visit on 04/29/2018.   Specialty:  General Surgery Contact information: [address] [place] [id number] [phone]           Signed: Franky MachoMark Tamberlyn Midgley 04/15/2018, 7:26 AM

## 2018-04-15 NOTE — Discharge Instructions (Signed)
Thyroidectomy, Care After °Refer to this sheet in the next few weeks. These instructions provide you with information about caring for yourself after your procedure. Your health care provider may also give you more specific instructions. Your treatment has been planned according to current medical practices, but problems sometimes occur. Call your health care provider if you have any problems or questions after your procedure. °What can I expect after the procedure? °After your procedure, it is typical to have: °· Mild pain in the neck or upper body, especially when swallowing. °· A sore throat. °· A weak voice. ° °Follow these instructions at home: °· Take medicines only as directed by your health care provider. °· If your entire thyroid gland was removed, you may need to take thyroid hormone medicine from now on. °· Do not take medicines that contain aspirin and ibuprofen until your health care provider says that you can. These medicines can increase your risk of bleeding. °· Some pain medicines cause constipation. Drink enough fluid to keep your urine clear or pale yellow. This can help to prevent constipation. °· Start slowly with eating. You may need to have only liquids and soft foods for a few days or as directed by your health care provider. °· Do not take baths, swim, or use a hot tub until your health care provider approves. °· There are many different ways to close and cover an incision, including stitches (sutures), skin glue, and adhesive strips. Follow your health care provider's instructions for: °? Incision care. °? Bandage (dressing) changes and removal. °? Incision closure removal. °· Resume your usual activities as directed by your health care provider. °· For the first 10 days after the procedure or as instructed by your health care provider: °? Do not lift anything heavier than 20 lb (9.1 kg). °? Do not jog, swim, or do other strenuous exercises. °? Do not play contact sports. °· Keep all  follow-up visits as directed by your health care provider. This is important. °Contact a health care provider if: °· The soreness in your throat gets worse. °· You have increased pain at your incision or incisions. °· You have increased bleeding from an incision. °· Your incision becomes infected. Watch for: °? Swelling. °? Redness. °? Warmth. °? Pus. °· You notice a bad smell coming from an incision or dressing. °· You have a fever. °· You feel lightheaded or faint. °· You have numbness, tingling, or muscle spasms in your: °? Arms. °? Hands. °? Feet. °? Face. °· You have trouble swallowing. °Get help right away if: °· You develop a rash. °· You have difficulty breathing. °· You hear whistling noises coming from your chest. °· You develop a cough that gets worse. °· Your speech changes, or you have hoarseness that gets worse. °This information is not intended to replace advice given to you by your health care provider. Make sure you discuss any questions you have with your health care provider. °Document Released: 03/23/2005 Document Revised: 05/06/2016 Document Reviewed: 02/03/2014 °Elsevier Interactive Patient Education © 2018 [employer] ° °

## 2018-04-15 NOTE — Plan of Care (Signed)
progressing 

## 2018-04-15 NOTE — Progress Notes (Signed)
Iv removed. Clean, dry, intact. Reviewed d/c paperwork with patient and husband. Answered all questions. Josh wheeled stabler patient to main entrance where he was picked up by husband.

## 2018-04-15 NOTE — Addendum Note (Signed)
Addendum  created 04/15/18 1044 by Despina HiddenIdacavage, Sharyl Panchal J, CRNA   Sign clinical note

## 2018-04-29 ENCOUNTER — Ambulatory Visit (INDEPENDENT_AMBULATORY_CARE_PROVIDER_SITE_OTHER): Payer: Self-pay | Admitting: General Surgery

## 2018-04-29 ENCOUNTER — Encounter: Payer: Self-pay | Admitting: General Surgery

## 2018-04-29 VITALS — BP 118/65 | HR 67 | Temp 98.2°F | Resp 20 | Wt 138.0 lb

## 2018-04-29 DIAGNOSIS — Z09 Encounter for follow-up examination after completed treatment for conditions other than malignant neoplasm: Secondary | ICD-10-CM

## 2018-04-29 NOTE — Progress Notes (Signed)
Subjective:     Bridget Woods  Status post total thyroidectomy.  Doing well.  Has no voice changes.  Does feel tired.  She is taking her Synthroid as prescribed.  She has no muscle twitching or tingling.  She does state that her tiredness is resolving slowly. Objective:    BP 118/65 (BP Location: Left Arm, Patient Position: Sitting, Cuff Size: Normal)   Pulse 67   Temp 98.2 F (36.8 C) (Temporal)   Resp 20   Wt 138 lb (62.6 kg)   BMI 25.24 kg/m   General:  alert, cooperative and no distress  Neck incision healing well. Final pathology negative for malignancy.     Assessment:    Doing well postoperatively.    Plan:   Is to follow-up with Dr. Fransico HimNida in 8 weeks.  Follow-up here as needed.

## 2018-06-13 LAB — COMPLETE METABOLIC PANEL WITH GFR
AG RATIO: 1.2 (calc) (ref 1.0–2.5)
ALBUMIN MSPROF: 3.9 g/dL (ref 3.6–5.1)
ALKALINE PHOSPHATASE (APISO): 58 U/L (ref 33–130)
ALT: 8 U/L (ref 6–29)
AST: 12 U/L (ref 10–35)
BILIRUBIN TOTAL: 0.7 mg/dL (ref 0.2–1.2)
BUN: 11 mg/dL (ref 7–25)
CHLORIDE: 106 mmol/L (ref 98–110)
CO2: 26 mmol/L (ref 20–32)
Calcium: 9.3 mg/dL (ref 8.6–10.4)
Creat: 0.53 mg/dL (ref 0.50–0.99)
GFR, EST AFRICAN AMERICAN: 117 mL/min/{1.73_m2} (ref >=60)
GFR, Est Non African American: 101 mL/min/{1.73_m2} (ref >=60)
Globulin: 3.2 g/dL (calc) (ref 1.9–3.7)
Glucose, Bld: 107 mg/dL (ref 65–139)
POTASSIUM: 3.7 mmol/L (ref 3.5–5.3)
Sodium: 140 mmol/L (ref 135–146)
TOTAL PROTEIN: 7.1 g/dL (ref 6.1–8.1)

## 2018-06-13 LAB — T4, FREE: FREE T4: 1.7 ng/dL (ref 0.8–1.8)

## 2018-06-13 LAB — TSH: TSH: 0.06 mIU/L — ABNORMAL LOW (ref 0.40–4.50)

## 2018-06-18 ENCOUNTER — Ambulatory Visit (INDEPENDENT_AMBULATORY_CARE_PROVIDER_SITE_OTHER): Payer: Medicare PPO | Admitting: "Endocrinology

## 2018-06-18 ENCOUNTER — Encounter: Payer: Self-pay | Admitting: "Endocrinology

## 2018-06-18 VITALS — BP 130/68 | HR 89 | Ht 62.0 in | Wt 138.0 lb

## 2018-06-18 DIAGNOSIS — E890 Postprocedural hypothyroidism: Secondary | ICD-10-CM | POA: Diagnosis not present

## 2018-06-18 MED ORDER — LEVOTHYROXINE SODIUM 88 MCG PO TABS: 88.0000 ug | ORAL_TABLET | Freq: Every day | ORAL | 3 refills | Status: DC

## 2018-06-18 NOTE — Progress Notes (Signed)
Endocrinology follow-up  Note                                            06/18/2018, 6:07 PM   Subjective:    Patient ID: Bridget Woods, female    DOB: Aug 16, 1956, PCP Margo Common Jarvis Newcomer, MD   Past Medical History:  Diagnosis Date  . Anxiety   . Arthritis    RA  . Depression   . Fibromyalgia   . GERD (gastroesophageal reflux disease)   . Headache   . Hypertension   . Hyperthyroidism    Past Surgical History:  Procedure Laterality Date  . ABDOMINAL HYSTERECTOMY    . BREAST SURGERY Left    removal of benign tumor  . HERNIA REPAIR     Umbilical  . THYROIDECTOMY N/A 04/14/2018   Procedure: TOTAL THYROIDECTOMY;  Surgeon: Aviva Signs, MD;  Location: [facility];  Service: General;  Laterality: N/A;   Social History   Socioeconomic History  . Marital status: Married    Spouse name: Not on file  . Number of children: Not on file  . Years of education: Not on file  . Highest education level: Not on file  Occupational History  . Not on file  Social Needs  . Financial resource strain: Not on file  . Food insecurity:    Worry: Not on file    Inability: Not on file  . Transportation needs:    Medical: Not on file    Non-medical: Not on file  Tobacco Use  . Smoking status: Never Smoker  . Smokeless tobacco: Never Used  Substance and Sexual Activity  . Alcohol use: Not Currently  . Drug use: Never  . Sexual activity: Yes    Birth control/protection: Surgical  Lifestyle  . Physical activity:    Days per week: Not on file    Minutes per session: Not on file  . Stress: Not on file  Relationships  . Social connections:    Talks on phone: Not on file    Gets together: Not on file    Attends religious service: Not on file    Active member of club or organization: Not on file    Attends meetings of clubs or organizations: Not on file    Relationship status: Not on file  Other Topics Concern  . Not on file  Social History Narrative  . Not on file    Outpatient Encounter Medications as of 06/18/2018  Medication Sig  . alendronate (FOSAMAX) 70 MG tablet Take 70 mg by mouth every Sunday.  . ALPRAZolam (XANAX) 0.5 MG tablet Take 0.5 mg by mouth 3 (three) times daily as needed for anxiety.  Marland Kitchen aspirin EC 81 MG tablet Take 81 mg by mouth at bedtime.   Marland Kitchen aspirin-acetaminophen-caffeine (EXCEDRIN MIGRAINE) 250-250-65 MG tablet Take 2 tablets by mouth 2 (two) times daily as needed for headache or migraine.  Marland Kitchen atorvastatin (LIPITOR) 10 MG tablet Take 10 mg by mouth daily.  . BELSOMRA 10 MG TABS Take 10 mg by mouth at bedtime.  . calcium-vitamin D (OSCAL WITH D) 500-200 MG-UNIT tablet Take 1 tablet by mouth 2 (two) times daily.  Marland Kitchen diltiazem (CARDIZEM CD) 180 MG 24 hr capsule Take 180 mg by mouth daily.  . DULoxetine (CYMBALTA) 60 MG capsule Take 60 mg by mouth 2 (two) times daily.  Marland Kitchen  folic acid (FOLVITE) 1 MG tablet Take 1 mg by mouth daily.  . hydrALAZINE (APRESOLINE) 100 MG tablet Take 100 mg by mouth 3 (three) times daily.  Marland Kitchen HYDROcodone-acetaminophen (NORCO) 5-325 MG tablet Take 1 tablet by mouth every 4 (four) hours as needed for moderate pain.  Marland Kitchen levothyroxine (SYNTHROID, LEVOTHROID) 88 MCG tablet Take 1 tablet (88 mcg total) by mouth daily before breakfast.  . meloxicam (MOBIC) 15 MG tablet Take 15 mg by mouth daily.  . potassium chloride SA (K-DUR,KLOR-CON) 20 MEQ tablet Take 20 mEq by mouth 2 (two) times daily.  . predniSONE (DELTASONE) 5 MG tablet Take 5 mg by mouth daily as needed. For rheumatoid arthritis flare ups  . propranolol (INDERAL) 40 MG tablet Take 60 mg by mouth 2 (two) times daily.  . ranitidine (ZANTAC) 150 MG tablet Take 150 mg by mouth 2 (two) times daily.  Marland Kitchen tiZANidine (ZANAFLEX) 4 MG tablet Take 4 mg by mouth See admin instructions. Take 1 tablet (4 mg) in the morning & 2 tablets (8 mg) at bedtime.  . [DISCONTINUED] levothyroxine (SYNTHROID, LEVOTHROID) 100 MCG tablet Take 1 tablet (100 mcg total) by mouth daily before  breakfast.   No facility-administered encounter medications on file as of 06/18/2018.    ALLERGIES: Allergies  Allergen Reactions  . Naproxen Other (See Comments)    Contact Dermatitis--oral blisters  . Ciprofibrate Nausea Only    VACCINATION STATUS:  There is no immunization history on file for this patient.  HPI Bridget Woods is 62 y.o. 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    She was sent for thyroidectomy.  She underwent total thyroidectomy on April 14, 2018 with benign outcomes.  She is currently on levothyroxine 100 mcg p.o. every morning, reports compliance.   She has no new complaints.  She reports better energy level.  She denies palpitations, tremors, nor heat intolerance.  She continued to improve in her neck compressive symptoms after her surgery.    Review of Systems  Constitutional:  + Steady weight, - fatigue, no subjective hyperthermia, no subjective hypothermia Eyes: no blurry vision, no xerophthalmia ENT: no sore throat,+ recent thyroidectomy, -dysphagia, -odynophagia, - voice hoarseness Cardiovascular: no Chest Pain, - Shortness of Breath, no palpitations, no leg swelling  Gastrointestinal: no Nausea/Vomiting/Diarhhea Musculoskeletal: no muscle/joint aches Skin: no rashes Neurological: no tremors, no numbness, no tingling, no dizziness Psychiatric: no depression, no anxiety  Objective:    BP 130/68   Pulse 89   Ht _0  (1.575 m)   Wt 138 lb (62.6 kg)   BMI 25.24 kg/m   Wt Readings from Last 3 Encounters:  06/18/18 138 lb (62.6 kg)  04/29/18 138 lb (62.6 kg)  04/14/18 142 lb (64.4 kg)    Physical Exam  Constitutional: + Appropriate weight for height, not in acute distress, normal state of mind Eyes: PERRLA, EOMI, no exophthalmos ENT: moist mucous membranes, + healing  post thyroidectomy scar, no cervical lymphadenopathyt Musculoskeletal: no gross deformities, strength intact in all four  extremities Skin: moist, warm, no rashes Neurological: no tremor with outstretched hands, Deep tendon reflexes normal in all four extremities.    Recent Results (from the past 2160 hour(s))  T3     Status: None   Collection Time: 04/07/18  9:32 AM  Result Value Ref Range   T3, Total 106 71 - 180 ng/dL    Comment: (NOTE) Performed At: [facility] [place], [place] [id number] Rush Farmer MD  [id number]   T4     Status: None   Collection Time: 04/07/18  9:32 AM  Result Value Ref Range   T4, Total 7.1 4.5 - 12.0 ug/dL    Comment: (NOTE) Performed At: [facility] [place], [place] [id number] Rush Farmer MD [id number]   TSH     Status: None   Collection Time: 04/07/18  9:32 AM  Result Value Ref Range   TSH 0.415 0.350 - 4.500 uIU/mL    Comment: Performed by a 3rd Generation assay with a functional sensitivity of <=0.01 uIU/mL. Performed at [facility], [address], [place], [place] [address]   CBC WITH DIFFERENTIAL     Status: Abnormal   Collection Time: 04/07/18  9:32 AM  Result Value Ref Range   WBC 5.9 4.0 - 10.5 K/uL   RBC 3.79 (L) 3.87 - 5.11 MIL/uL   Hemoglobin 11.9 (L) 12.0 - 15.0 g/dL   HCT 36.3 36.0 - 46.0 %   MCV 95.8 78.0 - 100.0 fL   MCH 31.4 26.0 - 34.0 pg   MCHC 32.8 30.0 - 36.0 g/dL   RDW 12.9 11.5 - 15.5 %   Platelets 367 150 - 400 K/uL   Neutrophils Relative % 53 %   Neutro Abs 3.2 1.7 - 7.7 K/uL   Lymphocytes Relative 33 %   Lymphs Abs 2.0 0.7 - 4.0 K/uL   Monocytes Relative 10 %   Monocytes Absolute 0.6 0.1 - 1.0 K/uL   Eosinophils Relative 3 %   Eosinophils Absolute 0.2 0.0 - 0.7 K/uL   Basophils Relative 1 %   Basophils Absolute 0.0 0.0 - 0.1 K/uL    Comment: Performed at [facility], [address], [place], [place] [address]  Comprehensive metabolic panel     Status: Abnormal   Collection Time: 04/07/18  9:32 AM  Result Value Ref Range   Sodium 139 135 - 145 mmol/L   Potassium 3.7  3.5 - 5.1 mmol/L   Chloride 107 98 - 111 mmol/L    Comment: Please note change in reference range.   CO2 25 22 - 32 mmol/L   Glucose, Bld 103 (H) 70 - 99 mg/dL    Comment: Please note change in reference range.   BUN 10 8 - 23 mg/dL    Comment: Please note change in reference range.   Creatinine, Ser 0.60 0.44 - 1.00 mg/dL   Calcium 8.9 8.9 - 10.3 mg/dL   Total Protein 7.5 6.5 - 8.1 g/dL   Albumin 3.9 3.5 - 5.0 g/dL   AST 17 15 - 41 U/L   ALT 15 0 - 44 U/L    Comment: Please note change in reference range.   Alkaline Phosphatase 49 38 - 126 U/L   Total Bilirubin 0.9 0.3 - 1.2 mg/dL   GFR calc non Af Amer >60 >60 mL/min   GFR calc Af Amer >60 >60 mL/min    Comment: (NOTE) The eGFR has been calculated using the CKD EPI equation. This calculation has not been validated in all clinical situations. eGFR's persistently <60 mL/min signify possible Chronic Kidney Disease.    Anion gap 7 5 - 15    Comment: Performed at [facility], [address], [place], [place] [address]  Type and screen [facility]     Status: None   Collection Time: 04/07/18  9:32 AM  Result Value Ref Range   ABO/RH(D) B POS    Antibody Screen NEG    Sample Expiration  04/21/2018 Performed at [facility], [address] [place], [place] [address]   Comprehensive metabolic panel     Status: Abnormal   Collection Time: 04/14/18  5:50 PM  Result Value Ref Range   Sodium 140 135 - 145 mmol/L   Potassium 4.1 3.5 - 5.1 mmol/L   Chloride 107 98 - 111 mmol/L   CO2 27 22 - 32 mmol/L   Glucose, Bld 81 70 - 99 mg/dL   BUN 9 8 - 23 mg/dL   Creatinine, Ser 0.53 0.44 - 1.00 mg/dL   Calcium 8.4 (L) 8.9 - 10.3 mg/dL   Total Protein 7.2 6.5 - 8.1 g/dL   Albumin 3.7 3.5 - 5.0 g/dL   AST 16 15 - 41 U/L   ALT 13 0 - 44 U/L   Alkaline Phosphatase 55 38 - 126 U/L   Total Bilirubin 0.9 0.3 - 1.2 mg/dL   GFR calc non Af Amer >60 >60 mL/min   GFR calc Af Amer >60 >60 mL/min    Comment: (NOTE) The eGFR has been  calculated using the CKD EPI equation. This calculation has not been validated in all clinical situations. eGFR's persistently <60 mL/min signify possible Chronic Kidney Disease.    Anion gap 6 5 - 15    Comment: Performed at [facility], [address], [place], [place] [address]  Comprehensive metabolic panel     Status: Abnormal   Collection Time: 04/15/18  5:55 AM  Result Value Ref Range   Sodium 140 135 - 145 mmol/L   Potassium 3.2 (L) 3.5 - 5.1 mmol/L    Comment: DELTA CHECK NOTED   Chloride 107 98 - 111 mmol/L   CO2 21 (L) 22 - 32 mmol/L   Glucose, Bld 73 70 - 99 mg/dL   BUN 13 8 - 23 mg/dL   Creatinine, Ser 0.64 0.44 - 1.00 mg/dL   Calcium 8.4 (L) 8.9 - 10.3 mg/dL   Total Protein 7.0 6.5 - 8.1 g/dL   Albumin 3.4 (L) 3.5 - 5.0 g/dL   AST 15 15 - 41 U/L   ALT 12 0 - 44 U/L   Alkaline Phosphatase 51 38 - 126 U/L   Total Bilirubin 1.3 (H) 0.3 - 1.2 mg/dL   GFR calc non Af Amer >60 >60 mL/min   GFR calc Af Amer >60 >60 mL/min    Comment: (NOTE) The eGFR has been calculated using the CKD EPI equation. This calculation has not been validated in all clinical situations. eGFR's persistently <60 mL/min signify possible Chronic Kidney Disease.    Anion gap 12 5 - 15    Comment: Performed at [facility], [address], [place], [place] [address]  CBC     Status: Abnormal   Collection Time: 04/15/18  5:55 AM  Result Value Ref Range   WBC 9.7 4.0 - 10.5 K/uL   RBC 3.72 (L) 3.87 - 5.11 MIL/uL   Hemoglobin 11.4 (L) 12.0 - 15.0 g/dL   HCT 36.2 36.0 - 46.0 %   MCV 97.3 78.0 - 100.0 fL   MCH 30.6 26.0 - 34.0 pg   MCHC 31.5 30.0 - 36.0 g/dL   RDW 12.9 11.5 - 15.5 %   Platelets 377 150 - 400 K/uL    Comment: Performed at [facility], [address], [place], [place] [address]  TSH     Status: Abnormal   Collection Time: 06/13/18  9:23 AM  Result Value Ref Range   TSH 0.06 (L) 0.40 - 4.50  mIU/L  T4, free     Status: None   Collection Time: 06/13/18  9:23 AM  Result Value Ref  Range   Free T4 1.7 0.8 - 1.8 ng/dL  COMPLETE METABOLIC PANEL WITH GFR     Status: None   Collection Time: 06/13/18  9:23 AM  Result Value Ref Range   Glucose, Bld 107 65 - 139 mg/dL    Comment: .        Non-fasting reference interval .    BUN 11 7 - 25 mg/dL   Creat 0.53 0.50 - 0.99 mg/dL    Comment: For patients >60 years of age, the reference limit for Creatinine is approximately 13% higher for people identified as African-American. .    GFR, Est Non African American 101 > OR = 60 mL/min/1.19m   GFR, Est African American 117 > OR = 60 mL/min/1.722m  BUN/Creatinine Ratio NOT APPLICABLE 6 - 22 (calc)   Sodium 140 135 - 146 mmol/L   Potassium 3.7 3.5 - 5.3 mmol/L   Chloride 106 98 - 110 mmol/L   CO2 26 20 - 32 mmol/L   Calcium 9.3 8.6 - 10.4 mg/dL   Total Protein 7.1 6.1 - 8.1 g/dL   Albumin 3.9 3.6 - 5.1 g/dL   Globulin 3.2 1.9 - 3.7 g/dL (calc)   AG Ratio 1.2 1.0 - 2.5 (calc)   Total Bilirubin 0.7 0.2 - 1.2 mg/dL   Alkaline phosphatase (APISO) 58 33 - 130 U/L   AST 12 10 - 35 U/L   ALT 8 6 - 29 U/L   July 29,2019: Surgical pathology-- negative for malignancy.  Assessment & Plan:   1. Multinodular goiter- resolved Surgical pathology - negative for malignancy  2. Post Surgical Hypothyroidism  Her previsit thyroid function tests are consistent with over replacement. I discussed and lowered her levothyroxine to 88 mcg p.o. early before breakfast.  - We discussed about correct intake of levothyroxine, at fasting, with water, separated by at least 30 minutes from breakfast, and separated by more than 4 hours from calcium, iron, multivitamins, acid reflux medications (PPIs). -Patient is made aware of the fact that thyroid hormone replacement is needed for life, dose to be adjusted by periodic monitoring of thyroid function tests. - I advised her  to maintain close follow up with PaMargo CommonmJarvis NewcomerMD for primary care needs.  Follow up plan: Return in about 3 months  (around 09/18/2018) for Follow up with Pre-visit Labs.   GeGlade LloydMD CoAllied Phys[facility] Surgery [facility] LLCroup ReOr[facility] LP[address] [address]hone: [phone]: [phone]   06/18/2018, 6:07 PM  This note was partially dictated with voice recognition software. Similar sounding words can be transcribed inadequately or may not  be corrected upon review.

## 2018-09-18 ENCOUNTER — Ambulatory Visit: Payer: Medicare PPO | Admitting: "Endocrinology

## 2018-10-03 ENCOUNTER — Other Ambulatory Visit: Payer: Self-pay | Admitting: "Endocrinology

## 2018-10-06 ENCOUNTER — Ambulatory Visit: Payer: Medicare PPO | Admitting: "Endocrinology

## 2018-10-16 ENCOUNTER — Ambulatory Visit: Payer: Self-pay | Admitting: "Endocrinology

## 2018-10-29 ENCOUNTER — Ambulatory Visit: Payer: Self-pay | Admitting: "Endocrinology

## 2018-11-05 LAB — T4, FREE: FREE T4: 1.7 ng/dL (ref 0.8–1.8)

## 2018-11-05 LAB — TSH: TSH: 0.07 m[IU]/L — AB (ref 0.40–4.50)

## 2018-11-12 ENCOUNTER — Encounter: Payer: Self-pay | Admitting: "Endocrinology

## 2018-11-12 ENCOUNTER — Ambulatory Visit: Payer: Medicare PPO | Admitting: "Endocrinology

## 2018-11-12 VITALS — BP 112/68 | HR 64 | Ht 62.0 in | Wt 151.0 lb

## 2018-11-12 DIAGNOSIS — E890 Postprocedural hypothyroidism: Secondary | ICD-10-CM | POA: Diagnosis not present

## 2018-11-12 MED ORDER — LEVOTHYROXINE SODIUM 75 MCG PO TABS
75.0000 ug | ORAL_TABLET | Freq: Every day | ORAL | 5 refills | Status: DC
Start: 2018-11-12 — End: 2018-12-10

## 2018-11-12 NOTE — Progress Notes (Signed)
Endocrinology follow-up  Note                                            11/12/2018, 11:23 AM   Subjective:    Patient ID: Bridget Woods, female    DOB: 01/06/1956, PCP Reynaldo Minium Franki Cabot, MD   Past Medical History:  Diagnosis Date  . Anxiety   . Arthritis    RA  . Depression   . Fibromyalgia   . GERD (gastroesophageal reflux disease)   . Headache   . Hypertension   . Hyperthyroidism    Past Surgical History:  Procedure Laterality Date  . ABDOMINAL HYSTERECTOMY    . BREAST SURGERY Left    removal of benign tumor  . HERNIA REPAIR     Umbilical  . THYROIDECTOMY N/A 04/14/2018   Procedure: TOTAL THYROIDECTOMY;  Surgeon: Franky Macho, MD;  Location: [facility];  Service: General;  Laterality: N/A;   Social History   Socioeconomic History  . Marital status: Married    Spouse name: Not on file  . Number of children: Not on file  . Years of education: Not on file  . Highest education level: Not on file  Occupational History  . Not on file  Social Needs  . Financial resource strain: Not on file  . Food insecurity:    Worry: Not on file    Inability: Not on file  . Transportation needs:    Medical: Not on file    Non-medical: Not on file  Tobacco Use  . Smoking status: Never Smoker  . Smokeless tobacco: Never Used  Substance and Sexual Activity  . Alcohol use: Not Currently  . Drug use: Never  . Sexual activity: Yes    Birth control/protection: Surgical  Lifestyle  . Physical activity:    Days per week: Not on file    Minutes per session: Not on file  . Stress: Not on file  Relationships  . Social connections:    Talks on phone: Not on file    Gets together: Not on file    Attends religious service: Not on file    Active member of club or organization: Not on file    Attends meetings of clubs or organizations: Not on file    Relationship status: Not on file  Other Topics Concern  . Not on file  Social History Narrative  . Not on file    Outpatient Encounter Medications as of 11/12/2018  Medication Sig  . alendronate (FOSAMAX) 70 MG tablet Take 70 mg by mouth every Sunday.  . ALPRAZolam (XANAX) 0.5 MG tablet Take 0.5 mg by mouth 3 (three) times daily as needed for anxiety.  Marland Kitchen amitriptyline (ELAVIL) 10 MG tablet at bedtime.   Marland Kitchen aspirin EC 81 MG tablet Take 81 mg by mouth at bedtime.   Marland Kitchen aspirin-acetaminophen-caffeine (EXCEDRIN MIGRAINE) 250-250-65 MG tablet Take 2 tablets by mouth 2 (two) times daily as needed for headache or migraine.  Marland Kitchen atorvastatin (LIPITOR) 10 MG tablet Take 10 mg by mouth daily.  . BELSOMRA 10 MG TABS Take 10 mg by mouth at bedtime.  . calcium-vitamin D (OSCAL WITH D) 500-200 MG-UNIT tablet Take 1 tablet by mouth 2 (two) times daily.  Marland Kitchen diltiazem (CARDIZEM CD) 180 MG 24 hr capsule Take 180 mg by mouth daily.  . DULoxetine (CYMBALTA) 60 MG capsule  Take 60 mg by mouth 2 (two) times daily.  . folic acid (FOLVITE) 1 MG tablet Take 1 mg by mouth daily.  . hydrALAZINE (APRESOLINE) 100 MG tablet Take 100 mg by mouth 3 (three) times daily.  Marland Kitchen HYDROcodone-acetaminophen (NORCO) 5-325 MG tablet Take 1 tablet by mouth every 4 (four) hours as needed for moderate pain.  Marland Kitchen levothyroxine (SYNTHROID, LEVOTHROID) 75 MCG tablet Take 1 tablet (75 mcg total) by mouth daily before breakfast.  . meloxicam (MOBIC) 15 MG tablet Take 15 mg by mouth daily.  . potassium chloride SA (K-DUR,KLOR-CON) 20 MEQ tablet Take 20 mEq by mouth 2 (two) times daily.  . predniSONE (DELTASONE) 5 MG tablet Take 5 mg by mouth daily as needed. For rheumatoid arthritis flare ups  . propranolol (INDERAL) 40 MG tablet Take 60 mg by mouth 2 (two) times daily.  . ranitidine (ZANTAC) 150 MG tablet Take 150 mg by mouth 2 (two) times daily.  Marland Kitchen tiZANidine (ZANAFLEX) 4 MG tablet Take 4 mg by mouth See admin instructions. Take 1 tablet (4 mg) in the morning & 2 tablets (8 mg) at bedtime.  . [DISCONTINUED] levothyroxine (SYNTHROID, LEVOTHROID) 88 MCG tablet  TAKE ONE (1) TABLET (88 MCG TOTAL) BY MOUTH DAILY BEFORE BREAKFAST.   No facility-administered encounter medications on file as of 11/12/2018.    ALLERGIES: Allergies  Allergen Reactions  . Naproxen Other (See Comments)    Contact Dermatitis--oral blisters  . Ciprofibrate Nausea Only    VACCINATION STATUS:  There is no immunization history on file for this patient.  HPI Bridget Woods is 63 y.o. 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  She is status post total thyroidectomy on April 14, 2018 with benign outcomes.  She is currently on levothyroxine 88 mcg p.o. every morning returning for follow-up.  She reports compliance, denies palpitations, tremors, nor heat intolerance.  She reports better energy level.  She denies palpitations, tremors, nor heat intolerance.  She continued to improve in her neck compressive symptoms after her surgery.    Review of Systems  Constitutional:  + weight gain , - fatigue, no subjective hyperthermia, no subjective hypothermia Eyes: no blurry vision, no xerophthalmia ENT: no sore throat, + recent thyroidectomy, -dysphagia, -odynophagia, - voice hoarseness Musculoskeletal: no muscle/joint aches Skin: no rashes Neurological: no tremors, no numbness, no tingling, no dizziness Psychiatric: no depression, no anxiety  Objective:    BP 112/68   Pulse 64   Ht 5\' 2"  (1.575 m)   Wt 151 lb (68.5 kg)   BMI 27.62 kg/m   Wt Readings from Last 3 Encounters:  11/12/18 151 lb (68.5 kg)  06/18/18 138 lb (62.6 kg)  04/29/18 138 lb (62.6 kg)    Physical Exam  Constitutional: + Appropriate weight for height, not in acute distress, normal state of mind Eyes: PERRLA, EOMI, no exophthalmos ENT: moist mucous membranes, + healing  post thyroidectomy scar, no cervical lymphadenopathyt Musculoskeletal: no gross deformities, strength intact in all four extremities Skin: moist, warm, no rashes Neurological: no tremor  with outstretched hands, Deep tendon reflexes normal in all four extremities.    Recent Results (from the past 2160 hour(s))  TSH     Status: Abnormal   Collection Time: 11/04/18  1:24 PM  Result Value Ref Range   TSH 0.07 (L) 0.40 - 4.50 mIU/L  T4, free     Status: None   Collection Time: 11/04/18  1:24 PM  Result Value Ref Range  Free T4 1.7 0.8 - 1.8 ng/dL   July 62,6948: Surgical pathology-- negative for malignancy.  Assessment & Plan:   1. Post Surgical Hypothyroidism 2. Multinodular goiter- resolved Surgical pathology - negative for malignancy  Her previsit thyroid function tests are consistent with over replacement.  I discussed and lowered her levothyroxine further to 75 mcg p.o. every morning.     - We discussed about the correct intake of her thyroid hormone, on empty stomach at fasting, with water, separated by at least 30 minutes from breakfast and other medications,  and separated by more than 4 hours from calcium, iron, multivitamins, acid reflux medications (PPIs). -Patient is made aware of the fact that thyroid hormone replacement is needed for life, dose to be adjusted by periodic monitoring of thyroid function tests.  - I advised her  to maintain close follow up with Reynaldo Minium Franki Cabot, MD for primary care needs.  Follow up plan: Return in about 6 months (around 05/13/2019) for Follow up with Pre-visit Labs.   Marquis Lunch, MD [facility] [address] [place], [place] [address] Phone: [phone]  Fax: [phone]     11/12/2018, 11:23 AM  This note was partially dictated with voice recognition software. Similar sounding words can be transcribed inadequately or may not  be corrected upon review.

## 2018-12-10 ENCOUNTER — Telehealth: Payer: Self-pay

## 2018-12-10 DIAGNOSIS — E890 Postprocedural hypothyroidism: Secondary | ICD-10-CM

## 2018-12-10 MED ORDER — LEVOTHYROXINE SODIUM 75 MCG PO TABS: 75.0000 ug | ORAL_TABLET | Freq: Every day | ORAL | 5 refills | Status: DC

## 2018-12-10 NOTE — Telephone Encounter (Signed)
LeighAnn Kellar Westberg, CMA  

## 2019-01-11 ENCOUNTER — Encounter: Payer: Self-pay | Admitting: General Surgery

## 2019-01-16 ENCOUNTER — Other Ambulatory Visit: Payer: Self-pay | Admitting: "Endocrinology

## 2019-01-19 ENCOUNTER — Other Ambulatory Visit: Payer: Self-pay

## 2019-01-19 DIAGNOSIS — E890 Postprocedural hypothyroidism: Secondary | ICD-10-CM

## 2019-01-19 MED ORDER — LEVOTHYROXINE SODIUM 75 MCG PO TABS: 75.0000 ug | ORAL_TABLET | Freq: Every day | ORAL | 5 refills | Status: DC

## 2019-05-13 ENCOUNTER — Ambulatory Visit: Payer: Medicare PPO | Admitting: "Endocrinology

## 2019-06-02 ENCOUNTER — Other Ambulatory Visit: Payer: Self-pay | Admitting: "Endocrinology

## 2019-06-02 DIAGNOSIS — E890 Postprocedural hypothyroidism: Secondary | ICD-10-CM

## 2019-06-03 LAB — T4, FREE: Free T4: 1.1 ng/dL (ref 0.8–1.8)

## 2019-06-03 LAB — TSH: TSH: 2.28 mIU/L (ref 0.40–4.50)

## 2019-06-04 ENCOUNTER — Ambulatory Visit (INDEPENDENT_AMBULATORY_CARE_PROVIDER_SITE_OTHER): Payer: Medicare PPO | Admitting: "Endocrinology

## 2019-06-04 ENCOUNTER — Other Ambulatory Visit: Payer: Self-pay

## 2019-06-04 ENCOUNTER — Encounter: Payer: Self-pay | Admitting: "Endocrinology

## 2019-06-04 DIAGNOSIS — E890 Postprocedural hypothyroidism: Secondary | ICD-10-CM

## 2019-06-04 MED ORDER — LEVOTHYROXINE SODIUM 75 MCG PO TABS: 75.0000 ug | ORAL_TABLET | Freq: Every day | ORAL | 1 refills | Status: DC

## 2019-06-04 NOTE — Progress Notes (Signed)
06/04/2019, 12:45 PM                                Endocrinology Telehealth Visit Follow up Note -During COVID -19 Pandemic  I connected with Bridget Woods on 06/04/2019   by telephone and verified that I am speaking with the correct person using two identifiers. Bridget Woods, 14-Nov-1955. she has verbally consented to this visit. All issues noted in this document were discussed and addressed. The format was not optimal for physical exam.   Subjective:    Patient ID: Bridget Woods, female    DOB: 03-Mar-1956, PCP Reynaldo Minium Franki Cabot, MD   Past Medical History:  Diagnosis Date  . Anxiety   . Arthritis    RA  . Depression   . Fibromyalgia   . GERD (gastroesophageal reflux disease)   . Headache   . Hypertension   . Hyperthyroidism    Past Surgical History:  Procedure Laterality Date  . ABDOMINAL HYSTERECTOMY    . BREAST SURGERY Left    removal of benign tumor  . HERNIA REPAIR     Umbilical  . THYROIDECTOMY N/A 04/14/2018   Procedure: TOTAL THYROIDECTOMY;  Surgeon: Franky Macho, MD;  Location: [facility];  Service: General;  Laterality: N/A;   Social History   Socioeconomic History  . Marital status: Married    Spouse name: Not on file  . Number of children: Not on file  . Years of education: Not on file  . Highest education level: Not on file  Occupational History  . Not on file  Social Needs  . Financial resource strain: Not on file  . Food insecurity    Worry: Not on file    Inability: Not on file  . Transportation needs    Medical: Not on file    Non-medical: Not on file  Tobacco Use  . Smoking status: Never Smoker  . Smokeless tobacco: Never Used  Substance and Sexual Activity  . Alcohol use: Not Currently  . Drug use: Never  . Sexual activity: Yes    Birth control/protection: Surgical  Lifestyle  . Physical activity    Days per week: Not on file    Minutes per session: Not on file  . Stress: Not  on file  Relationships  . Social Musician on phone: Not on file    Gets together: Not on file    Attends religious service: Not on file    Active member of club or organization: Not on file    Attends meetings of clubs or organizations: Not on file    Relationship status: Not on file  Other Topics Concern  . Not on file  Social History Narrative  . Not on file   Outpatient Encounter Medications as of 06/04/2019  Medication Sig  . alendronate (FOSAMAX) 70 MG tablet Take 70 mg by mouth every Sunday.  . ALPRAZolam (XANAX) 0.5 MG tablet Take 0.5 mg by mouth 3 (three) times daily as needed for anxiety.  Marland Kitchen amitriptyline (ELAVIL) 10 MG tablet at bedtime.   Marland Kitchen aspirin EC 81 MG tablet Take 81 mg by mouth at bedtime.   Marland Kitchen  aspirin-acetaminophen-caffeine (EXCEDRIN MIGRAINE) 250-250-65 MG tablet Take 2 tablets by mouth 2 (two) times daily as needed for headache or migraine.  Marland Kitchen. atorvastatin (LIPITOR) 10 MG tablet Take 10 mg by mouth daily.  . BELSOMRA 10 MG TABS Take 10 mg by mouth at bedtime.  . calcium-vitamin D (OSCAL WITH D) 500-200 MG-UNIT tablet Take 1 tablet by mouth 2 (two) times daily.  Marland Kitchen. diltiazem (CARDIZEM CD) 180 MG 24 hr capsule Take 180 mg by mouth daily.  . DULoxetine (CYMBALTA) 60 MG capsule Take 60 mg by mouth 2 (two) times daily.  . folic acid (FOLVITE) 1 MG tablet Take 1 mg by mouth daily.  . hydrALAZINE (APRESOLINE) 100 MG tablet Take 100 mg by mouth 3 (three) times daily.  Marland Kitchen. HYDROcodone-acetaminophen (NORCO) 5-325 MG tablet Take 1 tablet by mouth every 4 (four) hours as needed for moderate pain.  Marland Kitchen. levothyroxine (SYNTHROID) 75 MCG tablet Take 1 tablet (75 mcg total) by mouth daily before breakfast.  . meloxicam (MOBIC) 15 MG tablet Take 15 mg by mouth daily.  . potassium chloride SA (K-DUR,KLOR-CON) 20 MEQ tablet Take 20 mEq by mouth 2 (two) times daily.  . predniSONE (DELTASONE) 5 MG tablet Take 5 mg by mouth daily as needed. For rheumatoid arthritis flare ups  .  propranolol (INDERAL) 40 MG tablet Take 60 mg by mouth 2 (two) times daily.  . ranitidine (ZANTAC) 150 MG tablet Take 150 mg by mouth 2 (two) times daily.  Marland Kitchen. tiZANidine (ZANAFLEX) 4 MG tablet Take 4 mg by mouth See admin instructions. Take 1 tablet (4 mg) in the morning & 2 tablets (8 mg) at bedtime.  . [DISCONTINUED] levothyroxine (SYNTHROID) 75 MCG tablet Take 1 tablet (75 mcg total) by mouth daily before breakfast.   No facility-administered encounter medications on file as of 06/04/2019.    ALLERGIES: Allergies  Allergen Reactions  . Naproxen Other (See Comments)    Contact Dermatitis--oral blisters  . Ciprofibrate Nausea Only    VACCINATION STATUS:  There is no immunization history on file for this patient.  HPI Bridget Woods is 63 y.o. 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  She is status post total thyroidectomy on April 14, 2018 with benign outcomes.  She is currently on levothyroxine 75 mcg p.o. nightly.  She reports compliance.  She has no new complaints today.      She  denies palpitations, tremors, nor heat intolerance.  She reports better energy level.  She denies palpitations, tremors, nor heat intolerance.  She continued to improve in her neck compressive symptoms after her surgery.    Review of Systems  Limited as above. Objective:    There were no vitals taken for this visit.  Wt Readings from Last 3 Encounters:  11/12/18 151 lb (68.5 kg)  06/18/18 138 lb (62.6 kg)  04/29/18 138 lb (62.6 kg)    Physical Exam   Recent Results (from the past 2160 hour(s))  T4, free     Status: None   Collection Time: 06/02/19  9:56 AM  Result Value Ref Range   Free T4 1.1 0.8 - 1.8 ng/dL  TSH     Status: None   Collection Time: 06/02/19  9:56 AM  Result Value Ref Range   TSH 2.28 0.40 - 4.50 mIU/L   July 29,2019: Surgical pathology-- negative for malignancy.  Assessment & Plan:   1. Post Surgical Hypothyroidism 2.  Multinodular goiter- resolved Surgical pathology - negative for malignancy  Her previsit thyroid function tests are consistent with appropriate replacement.  She is advised to continue levothyroxine 75 mcg p.o. daily before breakfast.     - We discussed about the correct intake of her thyroid hormone, on empty stomach at fasting, with water, separated by at least 30 minutes from breakfast and other medications,  and separated by more than 4 hours from calcium, iron, multivitamins, acid reflux medications (PPIs). -Patient is made aware of the fact that thyroid hormone replacement is needed for life, dose to be adjusted by periodic monitoring of thyroid function tests.   - I advised her  to maintain close follow up with Margo Common Jarvis Newcomer, MD for primary care needs.   Time for this visit: 15 minutes. Herminio Commons  participated in the discussions, expressed understanding, and voiced agreement with the above plans.  All questions were answered to her satisfaction. she is encouraged to contact clinic should she have any questions or concerns prior to her return visit.  Follow up plan: Return in about 6 months (around 12/02/2019) for Follow up with Pre-visit Labs.   Glade Lloyd, MD [facility] [address] [place],  [address] Phone: [phone]  Fax: [phone]     06/04/2019, 12:45 PM  This note was partially dictated with voice recognition software. Similar sounding words can be transcribed inadequately or may not  be corrected upon review.

## 2019-11-23 ENCOUNTER — Other Ambulatory Visit: Payer: Self-pay | Admitting: "Endocrinology

## 2019-11-23 DIAGNOSIS — E890 Postprocedural hypothyroidism: Secondary | ICD-10-CM

## 2019-12-04 ENCOUNTER — Other Ambulatory Visit: Payer: Self-pay

## 2019-12-04 ENCOUNTER — Ambulatory Visit: Payer: Medicare PPO | Admitting: "Endocrinology

## 2019-12-04 DIAGNOSIS — E890 Postprocedural hypothyroidism: Secondary | ICD-10-CM

## 2019-12-04 LAB — T4, FREE: Free T4: 1.3 ng/dL (ref 0.8–1.8)

## 2019-12-04 LAB — TSH: TSH: 0.59 mIU/L (ref 0.40–4.50)

## 2019-12-11 ENCOUNTER — Encounter: Payer: Self-pay | Admitting: "Endocrinology

## 2019-12-11 ENCOUNTER — Ambulatory Visit (INDEPENDENT_AMBULATORY_CARE_PROVIDER_SITE_OTHER): Payer: BLUE CROSS/BLUE SHIELD | Admitting: "Endocrinology

## 2019-12-11 DIAGNOSIS — E890 Postprocedural hypothyroidism: Secondary | ICD-10-CM

## 2019-12-11 MED ORDER — LEVOTHYROXINE SODIUM 75 MCG PO TABS: ORAL_TABLET | ORAL | 1 refills | Status: DC

## 2019-12-11 NOTE — Progress Notes (Signed)
12/11/2019, 1:03 PM                                                    Endocrinology Telehealth Visit Follow up Note -During COVID -19 Pandemic  I connected with Bridget Woods on 12/11/2019   by telephone and verified that I am speaking with the correct person using two identifiers. Bridget Woods, [birth year]. she has verbally consented to this visit. All issues noted in this document were discussed and addressed. The format was not optimal for physical exam.   Subjective:    Patient ID: Bridget Woods, female    DOB: 11-13-1955, PCP Margo Common Jarvis Newcomer, MD   Past Medical History:  Diagnosis Date  . Anxiety   . Arthritis    RA  . Depression   . Fibromyalgia   . GERD (gastroesophageal reflux disease)   . Headache   . Hypertension   . Hyperthyroidism    Past Surgical History:  Procedure Laterality Date  . ABDOMINAL HYSTERECTOMY    . BREAST SURGERY Left    removal of benign tumor  . HERNIA REPAIR     Umbilical  . THYROIDECTOMY N/A 04/14/2018   Procedure: TOTAL THYROIDECTOMY;  Surgeon: Aviva Signs, MD;  Location: [facility];  Service: General;  Laterality: N/A;   Social History   Socioeconomic History  . Marital status: Married    Spouse name: Not on file  . Number of children: Not on file  . Years of education: Not on file  . Highest education level: Not on file  Occupational History  . Not on file  Tobacco Use  . Smoking status: Never Smoker  . Smokeless tobacco: Never Used  Substance and Sexual Activity  . Alcohol use: Not Currently  . Drug use: Never  . Sexual activity: Yes    Birth control/protection: Surgical  Other Topics Concern  . Not on file  Social History Narrative  . Not on file   Social Determinants of Health   Financial Resource Strain:   . Difficulty of Paying Living Expenses:   Food Insecurity:   . Worried About Charity fundraiser in the Last Year:   . Arboriculturist in the Last Year:    Transportation Needs:   . Film/video editor (Medical):   Marland Kitchen Lack of Transportation (Non-Medical):   Physical Activity:   . Days of Exercise per Week:   . Minutes of Exercise per Session:   Stress:   . Feeling of Stress :   Social Connections:   . Frequency of Communication with Friends and Family:   . Frequency of Social Gatherings with Friends and Family:   . Attends Religious Services:   . Active Member of Clubs or Organizations:   . Attends Archivist Meetings:   Marland Kitchen Marital Status:    Outpatient Encounter Medications as of 12/11/2019  Medication Sig  . alendronate (FOSAMAX) 70 MG tablet Take 70 mg by mouth every Sunday.  . ALPRAZolam (XANAX) 0.5 MG tablet Take 0.5 mg by mouth 3 (three) times daily  as needed for anxiety.  Marland Kitchen amitriptyline (ELAVIL) 10 MG tablet at bedtime.   Marland Kitchen aspirin EC 81 MG tablet Take 81 mg by mouth at bedtime.   Marland Kitchen aspirin-acetaminophen-caffeine (EXCEDRIN MIGRAINE) 250-250-65 MG tablet Take 2 tablets by mouth 2 (two) times daily as needed for headache or migraine.  Marland Kitchen atorvastatin (LIPITOR) 10 MG tablet Take 10 mg by mouth daily.  . BELSOMRA 10 MG TABS Take 10 mg by mouth at bedtime.  . calcium-vitamin D (OSCAL WITH D) 500-200 MG-UNIT tablet Take 1 tablet by mouth 2 (two) times daily.  Marland Kitchen diltiazem (CARDIZEM CD) 180 MG 24 hr capsule Take 180 mg by mouth daily.  . DULoxetine (CYMBALTA) 60 MG capsule Take 60 mg by mouth 2 (two) times daily.  . folic acid (FOLVITE) 1 MG tablet Take 1 mg by mouth daily.  . hydrALAZINE (APRESOLINE) 100 MG tablet Take 100 mg by mouth 3 (three) times daily.  Marland Kitchen HYDROcodone-acetaminophen (NORCO) 5-325 MG tablet Take 1 tablet by mouth every 4 (four) hours as needed for moderate pain.  Marland Kitchen levothyroxine (SYNTHROID) 75 MCG tablet TAKE ONE (1) TABLET (75 MCG TOTAL) BY MOUTH DAILY BEFORE BREAKFAST.  . meloxicam (MOBIC) 15 MG tablet Take 15 mg by mouth daily.  . potassium chloride SA (K-DUR,KLOR-CON) 20 MEQ tablet Take 20 mEq by  mouth 2 (two) times daily.  . predniSONE (DELTASONE) 5 MG tablet Take 5 mg by mouth daily as needed. For rheumatoid arthritis flare ups  . propranolol (INDERAL) 40 MG tablet Take 60 mg by mouth 2 (two) times daily.  . ranitidine (ZANTAC) 150 MG tablet Take 150 mg by mouth 2 (two) times daily.  Marland Kitchen tiZANidine (ZANAFLEX) 4 MG tablet Take 4 mg by mouth See admin instructions. Take 1 tablet (4 mg) in the morning & 2 tablets (8 mg) at bedtime.  . [DISCONTINUED] levothyroxine (SYNTHROID) 75 MCG tablet TAKE ONE (1) TABLET (75 MCG TOTAL) BY MOUTH DAILY BEFORE BREAKFAST.    No facility-administered encounter medications on file as of 12/11/2019.   ALLERGIES: Allergies  Allergen Reactions  . Naproxen Other (See Comments)    Contact Dermatitis--oral blisters  . Ciprofibrate Nausea Only    VACCINATION STATUS:  There is no immunization history on file for this patient.  HPI Bridget Woods is 64 y.o. 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  She is status post total thyroidectomy on April 14, 2018 with benign outcomes.  She is currently on levothyroxine 75 mcg p.o. daily before breakfast.  She reports compliance.  She has no new complaints.  Her previsit thyroid function tests are consistent with appropriate replacement.  Her previous symptoms of dysphagia, shortness of breath of resolved.     She  denies palpitations, tremors, nor heat intolerance.  She reports better energy level.    Review of Systems  limited as above.   Objective:    There were no vitals taken for this visit.  Wt Readings from Last 3 Encounters:  11/12/18 151 lb (68.5 kg)  06/18/18 138 lb (62.6 kg)  04/29/18 138 lb (62.6 kg)    Physical Exam   Recent Results (from the past 2160 hour(s))  TSH     Status: None   Collection Time: 12/04/19 11:01 AM  Result Value Ref Range   TSH 0.59 0.40 - 4.50 mIU/L  T4, Free     Status: None   Collection Time: 12/04/19 11:01 AM  Result  Value Ref Range   Free T4 1.3  0.8 - 1.8 ng/dL   July 62,1308: Surgical pathology-- negative for malignancy.  Assessment & Plan:   1. Post Surgical Hypothyroidism 2. Multinodular goiter- resolved Surgical pathology - negative for malignancy  Her previsit thyroid function tests are consistent with appropriate replacement.  She is advised to continue  levothyroxine 75 mcg p.o. daily before breakfast.    - We discussed about the correct intake of her thyroid hormone, on empty stomach at fasting, with water, separated by at least 30 minutes from breakfast and other medications,  and separated by more than 4 hours from calcium, iron, multivitamins, acid reflux medications (PPIs). -Patient is made aware of the fact that thyroid hormone replacement is needed for life, dose to be adjusted by periodic monitoring of thyroid function tests.  - I advised her  to maintain close follow up with Reynaldo Minium Franki Cabot, MD for primary care needs.      - Time spent on this patient care encounter:  20 minutes of which 50% was spent in  counseling and the rest reviewing  her current and  previous labs / studies and medications  doses and developing a plan for long term care. Melina Fiddler  participated in the discussions, expressed understanding, and voiced agreement with the above plans.  All questions were answered to her satisfaction. she is encouraged to contact clinic should she have any questions or concerns prior to her return visit.   Follow up plan: Return in about 6 months (around 06/12/2020) for Follow up with Pre-visit Labs.   Marquis Lunch, MD [facility] [address] [place], [place] [address] Phone: [phone]  Fax: [phone]     12/11/2019, 1:03 PM  This note was partially dictated with voice recognition software. Similar sounding words can be transcribed inadequately or may not  be corrected upon review.

## 2020-04-12 ENCOUNTER — Other Ambulatory Visit: Payer: Self-pay

## 2020-04-12 DIAGNOSIS — E890 Postprocedural hypothyroidism: Secondary | ICD-10-CM

## 2020-04-12 MED ORDER — LEVOTHYROXINE SODIUM 75 MCG PO TABS: ORAL_TABLET | ORAL | 1 refills | Status: DC

## 2020-05-26 ENCOUNTER — Other Ambulatory Visit: Payer: Self-pay | Admitting: Nurse Practitioner

## 2020-05-26 ENCOUNTER — Other Ambulatory Visit (HOSPITAL_COMMUNITY): Payer: Self-pay | Admitting: Nurse Practitioner

## 2020-05-26 DIAGNOSIS — Z1211 Encounter for screening for malignant neoplasm of colon: Secondary | ICD-10-CM

## 2020-05-26 DIAGNOSIS — K219 Gastro-esophageal reflux disease without esophagitis: Secondary | ICD-10-CM

## 2020-05-26 DIAGNOSIS — K2210 Ulcer of esophagus without bleeding: Secondary | ICD-10-CM

## 2020-05-26 DIAGNOSIS — K5900 Constipation, unspecified: Secondary | ICD-10-CM

## 2020-05-26 DIAGNOSIS — R1310 Dysphagia, unspecified: Secondary | ICD-10-CM

## 2020-05-26 DIAGNOSIS — R1084 Generalized abdominal pain: Secondary | ICD-10-CM

## 2020-05-26 DIAGNOSIS — B3781 Candidal esophagitis: Secondary | ICD-10-CM

## 2020-06-03 ENCOUNTER — Ambulatory Visit (HOSPITAL_COMMUNITY)
Admission: RE | Admit: 2020-06-03 | Discharge: 2020-06-03 | Disposition: A | Discharge status: Home / Self Care | Payer: Medicare Other | Source: Ambulatory Visit | Attending: Nurse Practitioner | Admitting: Nurse Practitioner

## 2020-06-03 ENCOUNTER — Other Ambulatory Visit: Payer: Self-pay

## 2020-06-03 DIAGNOSIS — K5900 Constipation, unspecified: Secondary | ICD-10-CM | POA: Insufficient documentation

## 2020-06-03 DIAGNOSIS — R1084 Generalized abdominal pain: Secondary | ICD-10-CM | POA: Insufficient documentation

## 2020-06-03 DIAGNOSIS — B3781 Candidal esophagitis: Secondary | ICD-10-CM | POA: Insufficient documentation

## 2020-06-03 DIAGNOSIS — Z1211 Encounter for screening for malignant neoplasm of colon: Secondary | ICD-10-CM | POA: Insufficient documentation

## 2020-06-03 DIAGNOSIS — R1310 Dysphagia, unspecified: Secondary | ICD-10-CM | POA: Diagnosis present

## 2020-06-03 DIAGNOSIS — K219 Gastro-esophageal reflux disease without esophagitis: Secondary | ICD-10-CM | POA: Insufficient documentation

## 2020-06-03 DIAGNOSIS — K2210 Ulcer of esophagus without bleeding: Secondary | ICD-10-CM | POA: Insufficient documentation

## 2020-06-03 LAB — POCT I-STAT, CHEM 8
BUN: 6 mg/dL — ABNORMAL LOW (ref 8–23)
Calcium, Ion: 1.18 mmol/L (ref 1.15–1.40)
Chloride: 103 mmol/L (ref 98–111)
Creatinine, Ser: 0.7 mg/dL (ref 0.44–1.00)
Glucose, Bld: 92 mg/dL (ref 70–99)
HCT: 35 % — ABNORMAL LOW (ref 36.0–46.0)
Hemoglobin: 11.9 g/dL — ABNORMAL LOW (ref 12.0–15.0)
Potassium: 3.6 mmol/L (ref 3.5–5.1)
Sodium: 142 mmol/L (ref 135–145)
TCO2: 27 mmol/L (ref 22–32)

## 2020-06-03 MED ORDER — IOHEXOL 300 MG/ML  SOLN
100.0000 mL | Freq: Once | INTRAMUSCULAR | Status: AC | PRN
  Administered 2020-06-03: 100 mL via INTRAVENOUS

## 2020-06-13 ENCOUNTER — Other Ambulatory Visit: Payer: Self-pay

## 2020-06-13 DIAGNOSIS — E890 Postprocedural hypothyroidism: Secondary | ICD-10-CM

## 2020-06-15 ENCOUNTER — Ambulatory Visit: Payer: BLUE CROSS/BLUE SHIELD | Admitting: "Endocrinology

## 2020-06-20 ENCOUNTER — Telehealth: Payer: Self-pay

## 2020-06-20 NOTE — Telephone Encounter (Signed)
Can this pt be virtual on Thursday? She is sick

## 2020-06-20 NOTE — Telephone Encounter (Signed)
Yes

## 2020-06-22 LAB — T4, FREE: Free T4: 1.12 ng/dL (ref 0.82–1.77)

## 2020-06-22 LAB — TSH: TSH: 2.36 u[IU]/mL (ref 0.450–4.500)

## 2020-06-23 ENCOUNTER — Telehealth (INDEPENDENT_AMBULATORY_CARE_PROVIDER_SITE_OTHER): Payer: Medicare Other | Admitting: "Endocrinology

## 2020-06-23 ENCOUNTER — Encounter: Payer: Self-pay | Admitting: "Endocrinology

## 2020-06-23 DIAGNOSIS — E890 Postprocedural hypothyroidism: Secondary | ICD-10-CM | POA: Diagnosis not present

## 2020-06-23 NOTE — Progress Notes (Signed)
06/23/2020, 4:43 PM                                    Endocrinology Telehealth Visit Follow up Note -During COVID -19 Pandemic  This visit type was conducted  via phone due to national recommendations for restrictions regarding the COVID-19 Pandemic  in an effort to limit this patient's exposure and mitigate transmission of the corona virus.   I connected with Bridget Woods on 06/23/2020   by telephone and verified that I am speaking with the correct person using two identifiers. Bridget Woods, 04/06/1956. she has verbally consented to this visit.  I was in my office and patient was in her residence. No other persons were with me during the encounter. All issues noted in this document were discussed and addressed. The format was not optimal for physical exam.                       Subjective:    Patient ID: Bridget Woods, female    DOB: 09-03-1956, PCP Reynaldo Minium Franki Cabot, MD   Past Medical History:  Diagnosis Date  . Anxiety   . Arthritis    RA  . Depression   . Fibromyalgia   . GERD (gastroesophageal reflux disease)   . Headache   . Hypertension   . Hyperthyroidism    Past Surgical History:  Procedure Laterality Date  . ABDOMINAL HYSTERECTOMY    . BREAST SURGERY Left    removal of benign tumor  . HERNIA REPAIR     Umbilical  . THYROIDECTOMY N/A 04/14/2018   Procedure: TOTAL THYROIDECTOMY;  Surgeon: Franky Macho, MD;  Location: [facility];  Service: General;  Laterality: N/A;   Social History   Socioeconomic History  . Marital status: Married    Spouse name: Not on file  . Number of children: Not on file  . Years of education: Not on file  . Highest education level: Not on file  Occupational History  . Not on file  Tobacco Use  . Smoking status: Never Smoker  . Smokeless tobacco: Never Used  Vaping Use  . Vaping Use: Never used  Substance and Sexual Activity  . Alcohol use: Not Currently  . Drug use:  Never  . Sexual activity: Yes    Birth control/protection: Surgical  Other Topics Concern  . Not on file  Social History Narrative  . Not on file   Social Determinants of Health   Financial Resource Strain:   . Difficulty of Paying Living Expenses: Not on file  Food Insecurity:   . Worried About Programme researcher, broadcasting/film/video in the Last Year: Not on file  . Ran Out of Food in the Last Year: Not on file  Transportation Needs:   . Lack of Transportation (Medical): Not on file  . Lack of Transportation (Non-Medical): Not on file  Physical Activity:   . Days of Exercise per Week: Not on file  . Minutes of Exercise per Session: Not on file  Stress:   . Feeling of Stress : Not on file  Social Connections:   . Frequency of  Communication with Friends and Family: Not on file  . Frequency of Social Gatherings with Friends and Family: Not on file  . Attends Religious Services: Not on file  . Active Member of Clubs or Organizations: Not on file  . Attends Banker Meetings: Not on file  . Marital Status: Not on file   Outpatient Encounter Medications as of 06/23/2020  Medication Sig  . alendronate (FOSAMAX) 70 MG tablet Take 70 mg by mouth every Sunday.  . ALPRAZolam (XANAX) 0.5 MG tablet Take 0.5 mg by mouth 3 (three) times daily as needed for anxiety.  Marland Kitchen amitriptyline (ELAVIL) 10 MG tablet at bedtime.   Marland Kitchen aspirin EC 81 MG tablet Take 81 mg by mouth at bedtime.   Marland Kitchen aspirin-acetaminophen-caffeine (EXCEDRIN MIGRAINE) 250-250-65 MG tablet Take 2 tablets by mouth 2 (two) times daily as needed for headache or migraine.  Marland Kitchen atorvastatin (LIPITOR) 10 MG tablet Take 10 mg by mouth daily.  . BELSOMRA 10 MG TABS Take 10 mg by mouth at bedtime.  . calcium-vitamin D (OSCAL WITH D) 500-200 MG-UNIT tablet Take 1 tablet by mouth 2 (two) times daily.  Marland Kitchen diltiazem (CARDIZEM CD) 180 MG 24 hr capsule Take 180 mg by mouth daily.  . DULoxetine (CYMBALTA) 60 MG capsule Take 60 mg by mouth 2 (two) times  daily.  . folic acid (FOLVITE) 1 MG tablet Take 1 mg by mouth daily.  . hydrALAZINE (APRESOLINE) 100 MG tablet Take 100 mg by mouth 3 (three) times daily.  Marland Kitchen HYDROcodone-acetaminophen (NORCO) 5-325 MG tablet Take 1 tablet by mouth every 4 (four) hours as needed for moderate pain.  Marland Kitchen levothyroxine (SYNTHROID) 75 MCG tablet TAKE ONE (1) TABLET (75 MCG TOTAL) BY MOUTH DAILY BEFORE BREAKFAST.  . meloxicam (MOBIC) 15 MG tablet Take 15 mg by mouth daily.  . potassium chloride SA (K-DUR,KLOR-CON) 20 MEQ tablet Take 20 mEq by mouth 2 (two) times daily.  . predniSONE (DELTASONE) 5 MG tablet Take 5 mg by mouth daily as needed. For rheumatoid arthritis flare ups  . propranolol (INDERAL) 40 MG tablet Take 60 mg by mouth 2 (two) times daily.  . ranitidine (ZANTAC) 150 MG tablet Take 150 mg by mouth 2 (two) times daily.  Marland Kitchen tiZANidine (ZANAFLEX) 4 MG tablet Take 4 mg by mouth See admin instructions. Take 1 tablet (4 mg) in the morning & 2 tablets (8 mg) at bedtime.   No facility-administered encounter medications on file as of 06/23/2020.   ALLERGIES: Allergies  Allergen Reactions  . Naproxen Other (See Comments)    Contact Dermatitis--oral blisters  . Ciprofibrate Nausea Only    VACCINATION STATUS:  There is no immunization history on file for this patient.  HPI Bridget Woods is 64 y.o. 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  She is being engaged in telehealth via telephone for follow-up.   She is status post total thyroidectomy on April 14, 2018 with benign outcomes.  She is currently on levothyroxine 75 mcg p.o. daily before breakfast.   She reports compliance.  She has no new complaints.  Her previsit thyroid function tests are consistent with appropriate replacement.  Her previous symptoms of dysphagia, shortness of breath of resolved.     She  denies palpitations, tremors, nor heat intolerance.  She reports better energy level.    Review of  Systems  limited as above.   Objective:    There were no vitals taken for this visit.  Wt Readings  from Last 3 Encounters:  11/12/18 151 lb (68.5 kg)  06/18/18 138 lb (62.6 kg)  04/29/18 138 lb (62.6 kg)    Physical Exam   Recent Results (from the past 2160 hour(s))  I-STAT, chem 8     Status: Abnormal   Collection Time: 06/03/20 11:43 AM  Result Value Ref Range   Sodium 142 135 - 145 mmol/L   Potassium 3.6 3.5 - 5.1 mmol/L   Chloride 103 98 - 111 mmol/L   BUN 6 (L) 8 - 23 mg/dL   Creatinine, Ser 6.38 0.44 - 1.00 mg/dL   Glucose, Bld 92 70 - 99 mg/dL    Comment: Glucose reference range applies only to samples taken after fasting for at least 8 hours.   Calcium, Ion 1.18 1.15 - 1.40 mmol/L   TCO2 27 22 - 32 mmol/L   Hemoglobin 11.9 (L) 12.0 - 15.0 g/dL   HCT 17.7 (L) 36 - 46 %  TSH     Status: None   Collection Time: 06/21/20 11:18 AM  Result Value Ref Range   TSH 2.360 0.450 - 4.500 uIU/mL  T4, Free     Status: None   Collection Time: 06/21/20 11:18 AM  Result Value Ref Range   Free T4 1.12 0.82 - 1.77 ng/dL   July 11,6579: Surgical pathology-- negative for malignancy.  Assessment & Plan:   1. Post Surgical Hypothyroidism 2. Multinodular goiter- resolved Surgical pathology - negative for malignancy  Her previsit thyroid function tests are consistent with appropriate replacement.  She is advised to continue levothyroxine 75 mcg p.o. daily before breakfast.     - We discussed about the correct intake of her thyroid hormone, on empty stomach at fasting, with water, separated by at least 30 minutes from breakfast and other medications,  and separated by more than 4 hours from calcium, iron, multivitamins, acid reflux medications (PPIs). -Patient is made aware of the fact that thyroid hormone replacement is needed for life, dose to be adjusted by periodic monitoring of thyroid function tests.   - I advised her  to maintain close follow up with Reynaldo Minium Franki Cabot, MD  for primary care needs.     - Time spent on this patient care encounter:  20 minutes of which 50% was spent in  counseling and the rest reviewing  her current and  previous labs / studies and medications  doses and developing a plan for long term care. Bridget Woods  participated in the discussions, expressed understanding, and voiced agreement with the above plans.  All questions were answered to her satisfaction. she is encouraged to contact clinic should she have any questions or concerns prior to her return visit.   Follow up plan: Return in about 6 months (around 12/22/2020) for F/U with Pre-visit Labs.   Marquis Lunch, MD [facility] [address] [place], [place] [address] Phone: [phone]  Fax: [phone]     06/23/2020, 4:43 PM  This note was partially dictated with voice recognition software. Similar sounding words can be transcribed inadequately or may not  be corrected upon review.

## 2020-12-23 ENCOUNTER — Other Ambulatory Visit: Payer: Self-pay | Admitting: "Endocrinology

## 2020-12-23 ENCOUNTER — Ambulatory Visit: Payer: PRIVATE HEALTH INSURANCE | Admitting: "Endocrinology

## 2020-12-23 DIAGNOSIS — E890 Postprocedural hypothyroidism: Secondary | ICD-10-CM

## 2021-01-04 LAB — T4, FREE: Free T4: 1.37 ng/dL (ref 0.82–1.77)

## 2021-01-04 LAB — TSH: TSH: 0.438 u[IU]/mL — ABNORMAL LOW (ref 0.450–4.500)

## 2021-01-06 ENCOUNTER — Ambulatory Visit: Payer: Medicare HMO | Admitting: "Endocrinology

## 2021-01-06 ENCOUNTER — Encounter: Payer: Self-pay | Admitting: "Endocrinology

## 2021-01-06 ENCOUNTER — Other Ambulatory Visit: Payer: Self-pay

## 2021-01-06 VITALS — BP 126/78 | HR 84 | Ht 62.0 in | Wt 164.6 lb

## 2021-01-06 DIAGNOSIS — E890 Postprocedural hypothyroidism: Secondary | ICD-10-CM

## 2021-01-06 DIAGNOSIS — E6609 Other obesity due to excess calories: Secondary | ICD-10-CM | POA: Diagnosis not present

## 2021-01-06 DIAGNOSIS — Z6830 Body mass index (BMI) 30.0-30.9, adult: Secondary | ICD-10-CM

## 2021-01-06 DIAGNOSIS — E66811 Obesity, class 1: Secondary | ICD-10-CM | POA: Insufficient documentation

## 2021-01-06 MED ORDER — LEVOTHYROXINE SODIUM 75 MCG PO TABS: ORAL_TABLET | ORAL | 1 refills | Status: DC

## 2021-01-06 NOTE — Progress Notes (Signed)
01/06/2021, 11:05 AM      Endocrinology follow-up note                      Subjective:    Patient ID: Bridget Woods, female    DOB: Mar 22, 1956, PCP Bridget Woods   Past Medical History:  Diagnosis Date  . Anxiety   . Arthritis    RA  . Depression   . Fibromyalgia   . GERD (gastroesophageal reflux disease)   . Headache   . Hypertension   . Hyperthyroidism    Past Surgical History:  Procedure Laterality Date  . ABDOMINAL HYSTERECTOMY    . BREAST SURGERY Left    removal of benign tumor  . HERNIA REPAIR     Umbilical  . THYROIDECTOMY N/A 04/14/2018   Procedure: TOTAL THYROIDECTOMY;  Surgeon: Bridget Macho, Woods;  Location: [facility];  Service: General;  Laterality: N/A;   Social History   Socioeconomic History  . Marital status: Married    Spouse name: Not on file  . Number of children: Not on file  . Years of education: Not on file  . Highest education level: Not on file  Occupational History  . Not on file  Tobacco Use  . Smoking status: Never Smoker  . Smokeless tobacco: Never Used  Vaping Use  . Vaping Use: Never used  Substance and Sexual Activity  . Alcohol use: Not Currently  . Drug use: Never  . Sexual activity: Yes    Birth control/protection: Surgical  Other Topics Concern  . Not on file  Social History Narrative  . Not on file   Social Determinants of Health   Financial Resource Strain: Not on file  Food Insecurity: Not on file  Transportation Needs: Not on file  Physical Activity: Not on file  Stress: Not on file  Social Connections: Not on file   Outpatient Encounter Medications as of 01/06/2021  Medication Sig  . alendronate (FOSAMAX) 70 MG tablet Take 70 mg by mouth every Sunday.  . ALPRAZolam (XANAX) 0.5 MG tablet Take 0.5 mg by mouth 3 (three) times daily as needed for anxiety.  Marland Kitchen amitriptyline (ELAVIL) 10 MG tablet at bedtime.  (Patient not taking: Reported on 01/06/2021)  .  aspirin EC 81 MG tablet Take 81 mg by mouth at bedtime.   Marland Kitchen aspirin-acetaminophen-caffeine (EXCEDRIN MIGRAINE) 250-250-65 MG tablet Take 2 tablets by mouth 2 (two) times daily as needed for headache or migraine.  Marland Kitchen atorvastatin (LIPITOR) 10 MG tablet Take 10 mg by mouth daily.  . BELSOMRA 10 MG TABS Take 10 mg by mouth at bedtime. (Patient not taking: Reported on 01/06/2021)  . calcium-vitamin D (OSCAL WITH D) 500-200 MG-UNIT tablet Take 1 tablet by mouth 2 (two) times daily.  Marland Kitchen diltiazem (CARDIZEM CD) 180 MG 24 hr capsule Take 180 mg by mouth daily.  . DULoxetine (CYMBALTA) 60 MG capsule Take 60 mg by mouth 2 (two) times daily.  . folic acid (FOLVITE) 1 MG tablet Take 1 mg by mouth daily.  . hydrALAZINE (APRESOLINE) 100 MG tablet Take 100 mg by mouth 3 (three) times daily.  Marland Kitchen HYDROcodone-acetaminophen (NORCO) 5-325 MG tablet Take 1 tablet by mouth every 4 (four)  hours as needed for moderate pain.  Marland Kitchen levothyroxine (SYNTHROID) 75 MCG tablet TAKE ONE (1) TABLET (75 MCG TOTAL) BY MOUTH DAILY BEFORE BREAKFAST.  . meloxicam (MOBIC) 15 MG tablet Take 15 mg by mouth daily.  . potassium chloride SA (K-DUR,KLOR-CON) 20 MEQ tablet Take 20 mEq by mouth 2 (two) times daily.  . predniSONE (DELTASONE) 5 MG tablet Take 5 mg by mouth daily as needed. For rheumatoid arthritis flare ups  . propranolol (INDERAL) 40 MG tablet Take 60 mg by mouth 2 (two) times daily.  . QUEtiapine (SEROQUEL) 25 MG tablet Take 1 tablet by mouth at bedtime.  . QULIPTA 60 MG TABS Take 1 tablet by mouth at bedtime.  . ranitidine (ZANTAC) 150 MG tablet Take 150 mg by mouth 2 (two) times daily. (Patient not taking: Reported on 01/06/2021)  . tiZANidine (ZANAFLEX) 4 MG tablet Take 4 mg by mouth See admin instructions. Take 1 tablet (4 mg) in the morning & 2 tablets (8 mg) at bedtime.  Marland Kitchen UBRELVY 100 MG TABS Take 1 tablet by mouth daily as needed.  . [DISCONTINUED] levothyroxine (SYNTHROID) 75 MCG tablet TAKE ONE (1) TABLET (75 MCG TOTAL) BY  MOUTH DAILY BEFORE BREAKFAST.   No facility-administered encounter medications on file as of 01/06/2021.   ALLERGIES: Allergies  Allergen Reactions  . Naproxen Other (See Comments)    Contact Dermatitis--oral blisters  . Ciprofibrate Nausea Only    VACCINATION STATUS:  There is no immunization history on file for this patient.  HPI Bridget Woods is 65 y.o. 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  She is returning for follow-up for postsurgical hypothyroidism.  She is currently on levothyroxine 75 mcg p.o. daily before breakfast.   She is concerned about her recent weight gain.   She is status post total thyroidectomy on April 14, 2018 with benign outcomes.   She reports compliance.   Her previsit thyroid function tests are consistent with appropriate replacement.  Her previous symptoms of dysphagia, shortness of breath of resolved.     She  denies palpitations, tremors, nor heat intolerance.  She reports better energy level.    Review of Systems  limited as above.   Objective:    BP 126/78   Pulse 84   Ht 5\' 2"  (1.575 m)   Wt 164 lb 9.6 oz (74.7 kg)   BMI 30.11 kg/m   Wt Readings from Last 3 Encounters:  01/06/21 164 lb 9.6 oz (74.7 kg)  11/12/18 151 lb (68.5 kg)  06/18/18 138 lb (62.6 kg)    Physical Exam   Recent Results (from the past 2160 hour(s))  T4, Free     Status: None   Collection Time: 01/03/21 10:50 AM  Result Value Ref Range   Free T4 1.37 0.82 - 1.77 ng/dL  TSH     Status: Abnormal   Collection Time: 01/03/21 10:50 AM  Result Value Ref Range   TSH 0.438 (L) 0.450 - 4.500 uIU/mL   July 29,2019: Surgical pathology-- negative for malignancy.  Assessment & Plan:   1. Post Surgical Hypothyroidism 2. Multinodular goiter- resolved Surgical pathology - negative for malignancy 3.  Weight management  Her previsit thyroid function tests are consistent with appropriate replacement.  She is advised to  continue levothyroxine 75 mcg p.o. daily before breakfast.     - We discussed about the correct intake of her thyroid hormone, on empty stomach at fasting, with water, separated by at least 30 minutes  from breakfast and other medications,  and separated by more than 4 hours from calcium, iron, multivitamins, acid reflux medications (PPIs). -Patient is made aware of the fact that thyroid hormone replacement is needed for life, dose to be adjusted by periodic monitoring of thyroid function tests.  Regarding her weight gain: This is likely due to positive caloric intake. - she acknowledges that there is a room for improvement in her food and drink choices. - Suggestion is made for her to avoid simple carbohydrates  from her diet including Cakes, Sweet Desserts, Ice Cream, Soda (diet and regular), Sweet Tea, Candies, Chips, Cookies, Store Bought Juices, Alcohol in Excess of  1-2 drinks a day, Artificial Sweeteners,  Coffee Creamer, and "Sugar-free" Products, Lemonade. This will help patient to have more stable blood glucose profile and potentially avoid unintended weight gain.   - I advised her  to maintain close follow up with Bridget Woods for primary care needs.    I spent 31 minutes in the care of the patient today including review of labs from Thyroid Function, CMP, and other relevant labs ; imaging/biopsy records (current and previous including abstractions from other facilities); face-to-face time discussing  her lab results and symptoms, medications doses, her options of short and long term treatment based on the latest standards of care / guidelines;   and documenting the encounter.  Bridget Woods  participated in the discussions, expressed understanding, and voiced agreement with the above plans.  All questions were answered to her satisfaction. she is encouraged to contact clinic should she have any questions or concerns prior to her return visit.   Follow up plan: Return in  about 6 months (around 07/08/2021) for F/U with Pre-visit Labs.   Marquis Lunch, Woods [facility] [address] [place], [place] [address] Phone: [phone]  Fax: [phone]     01/06/2021, 11:05 AM  This note was partially dictated with voice recognition software. Similar sounding words can be transcribed inadequately or may not  be corrected upon review.

## 2021-01-25 ENCOUNTER — Other Ambulatory Visit: Payer: Self-pay | Admitting: "Endocrinology

## 2021-01-25 DIAGNOSIS — E890 Postprocedural hypothyroidism: Secondary | ICD-10-CM

## 2021-05-17 ENCOUNTER — Emergency Department (HOSPITAL_COMMUNITY): Payer: Medicare HMO

## 2021-05-17 ENCOUNTER — Other Ambulatory Visit: Payer: Self-pay

## 2021-05-17 ENCOUNTER — Observation Stay (HOSPITAL_COMMUNITY)
Admission: EM | Admit: 2021-05-17 | Discharge: 2021-05-19 | Disposition: A | Discharge status: Home / Self Care | Payer: Medicare HMO | Attending: Internal Medicine | Admitting: Internal Medicine

## 2021-05-17 ENCOUNTER — Encounter (HOSPITAL_COMMUNITY): Payer: Self-pay | Admitting: Emergency Medicine

## 2021-05-17 DIAGNOSIS — R200 Anesthesia of skin: Secondary | ICD-10-CM | POA: Diagnosis present

## 2021-05-17 DIAGNOSIS — G43909 Migraine, unspecified, not intractable, without status migrainosus: Principal | ICD-10-CM | POA: Insufficient documentation

## 2021-05-17 DIAGNOSIS — R531 Weakness: Secondary | ICD-10-CM

## 2021-05-17 DIAGNOSIS — E876 Hypokalemia: Secondary | ICD-10-CM

## 2021-05-17 DIAGNOSIS — K219 Gastro-esophageal reflux disease without esophagitis: Secondary | ICD-10-CM | POA: Diagnosis present

## 2021-05-17 DIAGNOSIS — Z79899 Other long term (current) drug therapy: Secondary | ICD-10-CM | POA: Insufficient documentation

## 2021-05-17 DIAGNOSIS — I700 Atherosclerosis of aorta: Secondary | ICD-10-CM | POA: Diagnosis not present

## 2021-05-17 DIAGNOSIS — I720 Aneurysm of carotid artery: Secondary | ICD-10-CM

## 2021-05-17 DIAGNOSIS — Z7982 Long term (current) use of aspirin: Secondary | ICD-10-CM | POA: Insufficient documentation

## 2021-05-17 DIAGNOSIS — E039 Hypothyroidism, unspecified: Secondary | ICD-10-CM | POA: Diagnosis not present

## 2021-05-17 DIAGNOSIS — I10 Essential (primary) hypertension: Secondary | ICD-10-CM | POA: Insufficient documentation

## 2021-05-17 DIAGNOSIS — D75839 Thrombocytosis, unspecified: Secondary | ICD-10-CM | POA: Diagnosis present

## 2021-05-17 DIAGNOSIS — E890 Postprocedural hypothyroidism: Secondary | ICD-10-CM | POA: Diagnosis present

## 2021-05-17 DIAGNOSIS — F32A Depression, unspecified: Secondary | ICD-10-CM | POA: Diagnosis present

## 2021-05-17 DIAGNOSIS — Z20822 Contact with and (suspected) exposure to covid-19: Secondary | ICD-10-CM | POA: Insufficient documentation

## 2021-05-17 LAB — COMPREHENSIVE METABOLIC PANEL
ALT: 17 U/L (ref 0–44)
AST: 20 U/L (ref 15–41)
Albumin: 4.2 g/dL (ref 3.5–5.0)
Alkaline Phosphatase: 75 U/L (ref 38–126)
Anion gap: 8 (ref 5–15)
BUN: 11 mg/dL (ref 8–23)
CO2: 28 mmol/L (ref 22–32)
Calcium: 9.1 mg/dL (ref 8.9–10.3)
Chloride: 99 mmol/L (ref 98–111)
Creatinine, Ser: 0.74 mg/dL (ref 0.44–1.00)
GFR, Estimated: >60 mL/min (ref >60)
Glucose, Bld: 97 mg/dL (ref 70–99)
Potassium: 2.3 mmol/L — CL (ref 3.5–5.1)
Sodium: 135 mmol/L (ref 135–145)
Total Bilirubin: 2.4 mg/dL — ABNORMAL HIGH (ref 0.3–1.2)
Total Protein: 8.5 g/dL — ABNORMAL HIGH (ref 6.5–8.1)

## 2021-05-17 LAB — CBC WITH DIFFERENTIAL/PLATELET
Abs Immature Granulocytes: 0.03 10*3/uL (ref 0.00–0.07)
Basophils Absolute: 0.1 10*3/uL (ref 0.0–0.1)
Basophils Relative: 1 %
Eosinophils Absolute: 0 10*3/uL (ref 0.0–0.5)
Eosinophils Relative: 0 %
HCT: 43 % (ref 36.0–46.0)
Hemoglobin: 14.5 g/dL (ref 12.0–15.0)
Immature Granulocytes: 0 %
Lymphocytes Relative: 37 %
Lymphs Abs: 3.7 10*3/uL (ref 0.7–4.0)
MCH: 32 pg (ref 26.0–34.0)
MCHC: 33.7 g/dL (ref 30.0–36.0)
MCV: 94.9 fL (ref 80.0–100.0)
Monocytes Absolute: 0.7 10*3/uL (ref 0.1–1.0)
Monocytes Relative: 8 %
Neutro Abs: 5.4 10*3/uL (ref 1.7–7.7)
Neutrophils Relative %: 54 %
Platelets: 459 10*3/uL — ABNORMAL HIGH (ref 150–400)
RBC: 4.53 MIL/uL (ref 3.87–5.11)
RDW: 12.7 % (ref 11.5–15.5)
WBC: 9.9 10*3/uL (ref 4.0–10.5)
nRBC: 0 % (ref 0.0–0.2)

## 2021-05-17 LAB — MAGNESIUM: Magnesium: 2 mg/dL (ref 1.7–2.4)

## 2021-05-17 LAB — PROTIME-INR
INR: 0.9 (ref 0.8–1.2)
Prothrombin Time: 12.5 seconds (ref 11.4–15.2)

## 2021-05-17 MED ORDER — ACETAMINOPHEN 650 MG RE SUPP: 650.0000 mg | Freq: Four times a day (QID) | RECTAL | Status: DC | PRN

## 2021-05-17 MED ORDER — POTASSIUM CHLORIDE IN NACL 40-0.9 MEQ/L-% IV SOLN
INTRAVENOUS | Status: DC
  Filled 2021-05-17 (×2): qty 1000

## 2021-05-17 MED ORDER — ASPIRIN EC 81 MG PO TBEC
81.0000 mg | DELAYED_RELEASE_TABLET | Freq: Every day | ORAL | Status: DC
  Administered 2021-05-18: 81 mg via ORAL
  Filled 2021-05-17: qty 1

## 2021-05-17 MED ORDER — ENOXAPARIN SODIUM 40 MG/0.4ML IJ SOSY: 40.0000 mg | PREFILLED_SYRINGE | Freq: Every day | INTRAMUSCULAR | Status: DC

## 2021-05-17 MED ORDER — QUETIAPINE FUMARATE 25 MG PO TABS
25.0000 mg | ORAL_TABLET | Freq: Every day | ORAL | Status: DC
  Administered 2021-05-18: 25 mg via ORAL
  Filled 2021-05-17: qty 1

## 2021-05-17 MED ORDER — ONDANSETRON HCL 4 MG/2ML IJ SOLN: 4.0000 mg | Freq: Four times a day (QID) | INTRAMUSCULAR | Status: DC | PRN

## 2021-05-17 MED ORDER — FAMOTIDINE 20 MG PO TABS
20.0000 mg | ORAL_TABLET | Freq: Two times a day (BID) | ORAL | Status: DC
  Administered 2021-05-18 – 2021-05-19 (×3): 20 mg via ORAL
  Filled 2021-05-17 (×3): qty 1

## 2021-05-17 MED ORDER — ATOGEPANT 60 MG PO TABS: 1.0000 | ORAL_TABLET | Freq: Every day | ORAL | Status: DC

## 2021-05-17 MED ORDER — HYDROCODONE-ACETAMINOPHEN 5-325 MG PO TABS: 1.0000 | ORAL_TABLET | ORAL | Status: DC | PRN

## 2021-05-17 MED ORDER — FOLIC ACID 1 MG PO TABS
1.0000 mg | ORAL_TABLET | Freq: Every day | ORAL | Status: DC
  Administered 2021-05-18 – 2021-05-19 (×2): 1 mg via ORAL
  Filled 2021-05-17 (×2): qty 1

## 2021-05-17 MED ORDER — ATORVASTATIN CALCIUM 10 MG PO TABS
10.0000 mg | ORAL_TABLET | Freq: Every day | ORAL | Status: DC
  Administered 2021-05-18 – 2021-05-19 (×2): 10 mg via ORAL
  Filled 2021-05-17 (×2): qty 1

## 2021-05-17 MED ORDER — MAGNESIUM SULFATE 2 GM/50ML IV SOLN
2.0000 g | Freq: Once | INTRAVENOUS | Status: AC
  Administered 2021-05-18: 2 g via INTRAVENOUS
  Filled 2021-05-17: qty 50

## 2021-05-17 MED ORDER — IOHEXOL 350 MG/ML SOLN
100.0000 mL | Freq: Once | INTRAVENOUS | Status: AC | PRN
  Administered 2021-05-17: 75 mL via INTRAVENOUS

## 2021-05-17 MED ORDER — ONDANSETRON HCL 4 MG PO TABS: 4.0000 mg | ORAL_TABLET | Freq: Four times a day (QID) | ORAL | Status: DC | PRN

## 2021-05-17 MED ORDER — LEVOTHYROXINE SODIUM 75 MCG PO TABS
75.0000 ug | ORAL_TABLET | Freq: Every day | ORAL | Status: DC
  Administered 2021-05-18 – 2021-05-19 (×2): 75 ug via ORAL
  Filled 2021-05-17: qty 1
  Filled 2021-05-17: qty 2

## 2021-05-17 MED ORDER — ACETAMINOPHEN 325 MG PO TABS
650.0000 mg | ORAL_TABLET | Freq: Four times a day (QID) | ORAL | Status: DC | PRN
  Administered 2021-05-18 (×2): 650 mg via ORAL
  Filled 2021-05-17 (×2): qty 2

## 2021-05-17 MED ORDER — ALPRAZOLAM 0.5 MG PO TABS: 0.5000 mg | ORAL_TABLET | Freq: Three times a day (TID) | ORAL | Status: DC | PRN

## 2021-05-17 MED ORDER — ENOXAPARIN SODIUM 40 MG/0.4ML IJ SOSY
40.0000 mg | PREFILLED_SYRINGE | INTRAMUSCULAR | Status: DC
  Administered 2021-05-18 – 2021-05-19 (×2): 40 mg via SUBCUTANEOUS
  Filled 2021-05-17 (×2): qty 0.4

## 2021-05-17 MED ORDER — DULOXETINE HCL 60 MG PO CPEP
60.0000 mg | ORAL_CAPSULE | Freq: Two times a day (BID) | ORAL | Status: DC
  Administered 2021-05-18 – 2021-05-19 (×3): 60 mg via ORAL
  Filled 2021-05-17 (×2): qty 1
  Filled 2021-05-17: qty 2

## 2021-05-17 MED ORDER — POTASSIUM CHLORIDE 10 MEQ/100ML IV SOLN
10.0000 meq | Freq: Once | INTRAVENOUS | Status: AC
  Administered 2021-05-17: 10 meq via INTRAVENOUS
  Filled 2021-05-17: qty 100

## 2021-05-17 MED ORDER — STROKE: EARLY STAGES OF RECOVERY BOOK
Freq: Once | Status: DC
  Filled 2021-05-17: qty 1

## 2021-05-17 NOTE — ED Triage Notes (Signed)
Pt c/o she woke with am with right sided numbness at 0630 and then developed chest pain and sob.

## 2021-05-17 NOTE — ED Notes (Signed)
Date and time results received: 05/17/21 2201  Test: Potassium Critical Value: 2.3  Name of Provider Notified: Rubin Payor, MD  Orders Received? Or Actions Taken?: acknowledged

## 2021-05-17 NOTE — ED Provider Notes (Signed)
[facility] EMERGENCY DEPARTMENT Provider Note   CSN: [id number] Arrival date & time: 05/17/21  1922     History Chief Complaint  Patient presents with   Weakness    Bridget Woods is a 65 y.o. female.   Weakness Associated symptoms: chest pain   Associated symptoms: no headaches   Patient presents with weakness on the right side.  States it feels numb to.  Has pain in her right shoulder.  Pain in her chest.  Also pain in her neck.  No headache.  States she has rheumatoid arthritis and is used to joint pains but usually does not get in the shoulder.  States she also has difficulty walking because her right leg will not work right.  States her right arm was also drawn up.  Has not had episodes like this before.  States she was normal when she went to bed last night.  No vision changes. Patient states she was feeling generally bad yesterday.  Got started on antibiotic for UTI by PCP.    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     Family History  Problem Relation Age of Onset   Seizures Mother    Prostate cancer Father    Thyroid cancer Brother    Breast cancer Paternal Aunt    Throat cancer Paternal Aunt     Social History   Tobacco Use   Smoking status: Never   Smokeless tobacco: Never  Vaping Use   Vaping Use: Never used  Substance Use Topics   Alcohol use: Not Currently   Drug use: Never    Home Medications Prior to Admission medications   Medication Sig Start Date End Date Taking? Authorizing Provider  alendronate (FOSAMAX) 70 MG tablet Take 70 mg by mouth every Sunday. 01/21/18   [provider]  ALPRAZolam Prudy Feeler) 0.5 MG tablet Take 0.5 mg by mouth 3 (three) times daily as needed for anxiety. 02/17/18   [provider]  amitriptyline (ELAVIL) 10 MG tablet at bedtime.  Patient not taking: Reported on 01/06/2021 11/04/18   [provider]  aspirin EC 81 MG tablet Take 81 mg by mouth at bedtime.     [provider]  aspirin-acetaminophen-caffeine (EXCEDRIN MIGRAINE) 301 424 8394 MG tablet Take 2 tablets by mouth 2 (two) times daily as needed for headache or migraine.  [provider]  atorvastatin (LIPITOR) 10 MG tablet Take 10 mg by mouth daily. 12/31/17   [provider]  BELSOMRA 10 MG TABS Take 10 mg by mouth at bedtime. Patient not taking: Reported on 01/06/2021 02/05/18   [provider]  calcium-vitamin D (OSCAL WITH D) 500-200 MG-UNIT tablet Take 1 tablet by mouth 2 (two) times daily.    [provider]  diltiazem (CARDIZEM CD) 180 MG 24 hr capsule Take 180 mg by mouth daily. 10/03/17   [provider]  DULoxetine (CYMBALTA) 60 MG capsule Take 60 mg by mouth 2 (two) times daily.    [provider]  folic acid (FOLVITE) 1 MG tablet Take 1 mg by mouth daily.    [provider]  hydrALAZINE (APRESOLINE) 100 MG tablet Take 100 mg by mouth 3 (three) times daily.     [provider]  HYDROcodone-acetaminophen (NORCO) 5-325 MG tablet Take 1 tablet by mouth every 4 (four) hours as needed for moderate pain. 04/15/18   Franky MachoJenkins, Mark, MD  levothyroxine (SYNTHROID) 75 MCG tablet TAKE 1 TABLET BY MOUTH DAILY BEFORE BREAKFAST 01/25/21   Roma KayserNida, Gebreselassie W, MD  meloxicam (MOBIC) 15 MG tablet Take 15 mg by mouth daily.    [provider]  potassium chloride SA (K-DUR,KLOR-CON) 20 MEQ tablet Take 20 mEq by mouth 2 (two) times daily.    [provider]  predniSONE (DELTASONE) 5 MG tablet Take 5 mg by mouth daily as needed. For rheumatoid arthritis flare ups 01/21/18   [provider]  propranolol (INDERAL) 40 MG tablet Take 60 mg by mouth 2 (two) times daily.    [provider]  QUEtiapine (SEROQUEL) 25 MG tablet Take 1 tablet by mouth at bedtime. 12/30/20   [provider]  QULIPTA 60 MG TABS Take 1 tablet by mouth at bedtime. 12/30/20   [provider]  ranitidine (ZANTAC) 150 MG tablet Take 150 mg by mouth 2 (two) times daily. Patient not taking: Reported on 01/06/2021    [provider]  tiZANidine (ZANAFLEX) 4 MG tablet Take 4 mg by mouth See admin instructions. Take 1 tablet (4 mg) in the morning & 2 tablets (8 mg) at bedtime. 01/21/18   [provider]  UBRELVY 100 MG TABS Take 1 tablet by mouth daily as needed. 12/16/20   [provider]    Allergies    Naproxen and Ciprofibrate  Review of Systems   Review of Systems  Constitutional:  Negative for appetite change.  HENT:  Negative for congestion.   Cardiovascular:  Positive for chest pain.  Genitourinary:  Negative for pelvic pain.  Musculoskeletal:  Negative for back pain.       Right shoulder pain.  Skin:  Negative for rash.  Neurological:  Positive for weakness. Negative for headaches.  Psychiatric/Behavioral:  Negative for confusion.    Physical Exam Updated Vital Signs BP (!) 175/94   Pulse 83   Temp 98.2 F (36.8  C)   Resp (!) 23   Ht 5\' 2"  (1.575 m)   Wt 69.9 kg   SpO2 94%   BMI 28.17 kg/m   Physical Exam Vitals and nursing note reviewed.  HENT:     Head: Atraumatic.  Eyes:     Extraocular Movements: Extraocular movements intact.     Pupils: Pupils are equal, round, and reactive to light.     Comments: Visual fields grossly intact by confrontation.  Cardiovascular:     Rate and Rhythm: Regular rhythm.  Abdominal:     Tenderness: There is no abdominal tenderness.  Musculoskeletal:     Cervical back: Neck supple.     Comments: Some tenderness to right shoulder.  Mild pain with movement.  Neurological:     Mental Status: She is alert.     Comments: Decreased movement in right arm compared to left.  Strength mildly decreased but also mildly flexed at elbow.  Decree straight leg raise on right side compared to left.  Mildly decreased sensation right lower leg.  Both lower extremities warm.  Face symmetric.  Sensation intact in bilateral upper extremities.    ED Results / Procedures / Treatments   Labs (all labs ordered are listed, but only abnormal results are displayed) Labs Reviewed  COMPREHENSIVE METABOLIC PANEL - Abnormal; Notable for the following components:      Result Value   Potassium 2.3 (*)    Total Protein 8.5 (*)    Total Bilirubin 2.4 (*)    All other components within normal limits  CBC WITH DIFFERENTIAL/PLATELET - Abnormal; Notable for the following components:   Platelets 459 (*)    All other components within normal limits  RESP PANEL BY RT-PCR (FLU A&B, COVID) ARPGX2  PROTIME-INR  MAGNESIUM  HEMOGLOBIN A1C  LIPID PANEL  HIV ANTIBODY (ROUTINE TESTING W REFLEX)    EKG EKG Interpretation  Date/Time:  Wednesday May 17 2021 19:41:59 EDT Ventricular Rate:  90 PR Interval:  171 QRS Duration: 88 QT Interval:  396 QTC Calculation: 485 R Axis:   -6 Text Interpretation: Sinus rhythm Abnormal R-wave progression, early transition Borderline T wave abnormalities  Confirmed by Benjiman Core [phone]) on 05/17/2021 9:40:46 PM  Radiology DG Chest Portable 1 View  Result Date: 05/17/2021 CLINICAL DATA:  Chest pain.  Right-sided numbness. EXAM: PORTABLE CHEST 1 VIEW COMPARISON:  CT abdomen pelvis 06/03/2020 FINDINGS: The heart and mediastinal contours are within normal limits. Persistent right base linear atelectasis versus scarring. no focal consolidation. No pulmonary edema. Nonspecific blunting of the right costophrenic angle with no definite pleural effusion. No pneumothorax. No acute osseous abnormality. IMPRESSION: No active disease. Electronically Signed   By: Tish Frederickson M.D.   On: 05/17/2021 20:46    Procedures Procedures   Medications Ordered in ED Medications   stroke: mapping our early stages of recovery book (0 each Does not apply Hold 05/18/21 0010)  acetaminophen (TYLENOL) tablet 650 mg (has no administration in time range)    Or  acetaminophen (TYLENOL) suppository 650 mg (has no administration in time range)  ondansetron (ZOFRAN) tablet 4 mg (has no administration in time range)    Or  ondansetron (ZOFRAN) injection 4 mg (has no administration in time range)  Atogepant TABS 1 tablet (has no administration in time range)  famotidine (PEPCID) tablet 20 mg (has no administration in time range)  QUEtiapine (SEROQUEL) tablet 25 mg (has no administration in time range)  levothyroxine (SYNTHROID) tablet 75 mcg (has no administration in time range)  HYDROcodone-acetaminophen (NORCO/VICODIN) 5-325 MG per tablet 1 tablet (has no administration in time range)  folic acid (FOLVITE) tablet 1 mg (has no administration in time range)  DULoxetine (CYMBALTA) DR capsule 60 mg (has no administration in time range)  atorvastatin (LIPITOR) tablet 10 mg (has no administration in time range)  aspirin EC tablet 81 mg (has no administration in time range)  ALPRAZolam (XANAX) tablet 0.5 mg (has no administration in time range)  0.9 % NaCl with KCl 40 mEq /  L  infusion (has no  administration in time range)  enoxaparin (LOVENOX) injection 40 mg (has no administration in time range)  magnesium sulfate IVPB 2 g 50 mL (has no administration in time range)  potassium chloride 10 mEq in 100 mL IVPB (0 mEq Intravenous Stopped 05/18/21 0010)  iohexol (OMNIPAQUE) 350 MG/ML injection 100 mL (75 mLs Intravenous Contrast Given 05/17/21 2212)    ED Course  I have reviewed the triage vital signs and the nursing notes.  Pertinent labs & imaging results that were available during my care of the patient were reviewed by me and considered in my medical decision making (see chart for details).    MDM Rules/Calculators/A&P                           Patient with right-sided weakness.  Woke with this morning.  Does have some deficits.  Not a tPA candidate due to last normal last night and not an LVO patient.  CTA done and reassuring.  Does have a hypokalemia.  Will supplement.  Treated for UTI yesterday.  Will admit to internal medicine. Final Clinical Impression(s) / ED Diagnoses Final diagnoses:  Weakness  Hypokalemia    Rx / DC Orders ED Discharge Orders     None        Benjiman Core, MD 05/18/21 0020

## 2021-05-17 NOTE — H&P (Signed)
History and Physical    Bridget FiddlerMaggie W Lecount UJW:[id number] DOB: 02/22/1956 DOA: 05/17/2021  PCP: Virl DiamondPallone, Amanda W, MD  Patient coming from: Home.  I have personally briefly [facility]  Chief Complaint: Right-sided weakness.  HPI: Bridget Woods is a 65 y.o. female with medical history significant of anxiety, depression, rheumatoid arthritis, fibromyalgia, GERD, headaches, hypertension, hyperthyroidism with thyroidectomy and postsurgical hypothyroidism who is coming to the emergency department with complaints of right-sided weakness since she woke up around 6:30 in the morning.  LKW was Tuesday evening.  She has had a frontal headache with visual disturbance, felt that she was unable to keep her right eye open, has had some difficulty ambulating which have improved since then.  She also has not been eating much since Sunday and stated yesterday she was momentarily diaphoretic.  She denied fever, chills, sore throat, rhinorrhea, productive cough, wheezing, hemoptysis, dyspnea, chest pain, palpitations, PND, orthopnea or increasing pitting edema lower extremities.  No abdominal pain, nausea, emesis, diarrhea, constipation, melena or hematochezia.  No dysuria, frequency or hematuria.  No polyuria, polydipsia, polyphagia or blurred vision.  ED Course: Initial vital signs were temperature 98.2 F, pulse 102, respiration 22, BP 188/96 mmHg O2 sat 99% on room air.  She received 10 mEq of KCl IVPB.  Lab work: Her CBC showed a white count of 9.9, hemoglobin 14.5 g/dL and platelets [id number].  Potassium was 2.30 mmol/L the rest of the electrolytes were normal.  Total protein is 8.5 g/dL and total bilirubin was 2.4 mg/dL.  Imaging: Portable chest radiograph did not show any active cardiopulmonary disease.  CTA head did not show any acute intracranial pathology.  There was a 3 x 3 mm aneurysm projecting superiorly from the clinoid segment of the right internal carotid  artery.  There was aortic atherosclerosis.  Review of Systems: As per HPI otherwise all other systems reviewed and are negative.  Past Medical History:  Diagnosis Date   Anxiety    Arthritis    RA   Depression    Fibromyalgia    GERD (gastroesophageal reflux disease)    Headache    Hypertension    Hyperthyroidism    Past Surgical History:  Procedure Laterality Date   ABDOMINAL HYSTERECTOMY     BREAST SURGERY Left    removal of benign tumor   HERNIA REPAIR     Umbilical   THYROIDECTOMY N/A 04/14/2018   Procedure: TOTAL THYROIDECTOMY;  Surgeon: Franky MachoJenkins, Mark, MD;  Location: [facility];  Service: General;  Laterality: N/A;   Social History  reports that she has never smoked. She has never used smokeless tobacco. She reports that she does not currently use alcohol. She reports that she does not use drugs.  Allergies  Allergen Reactions   Naproxen Other (See Comments)    Contact Dermatitis--oral blisters   Ciprofibrate Nausea Only    Family History  Problem Relation Age of Onset   Seizures Mother    Prostate cancer Father    Thyroid cancer Brother    Breast cancer Paternal Aunt    Throat cancer Paternal Aunt    Prior to Admission medications   Medication Sig Start Date End Date Taking? Authorizing Provider  alendronate (FOSAMAX) 70 MG tablet Take 70 mg by mouth every Sunday. 01/21/18   [provider]  ALPRAZolam Prudy Feeler(XANAX) 0.5 MG tablet Take 0.5 mg by mouth 3 (three) times daily as needed for anxiety. 02/17/18   [provider]  amitriptyline Caleb Popp(ELAVIL)  10 MG tablet at bedtime.  Patient not taking: Reported on 01/06/2021 11/04/18   [provider]  aspirin EC 81 MG tablet Take 81 mg by mouth at bedtime.     [provider]  aspirin-acetaminophen-caffeine (EXCEDRIN MIGRAINE) [id number] MG tablet Take 2 tablets by mouth 2 (two) times daily as needed for headache or migraine.    [provider]  atorvastatin (LIPITOR) 10 MG tablet Take 10 mg  by mouth daily. 12/31/17   [provider]  BELSOMRA 10 MG TABS Take 10 mg by mouth at bedtime. Patient not taking: Reported on 01/06/2021 02/05/18   [provider]  calcium-vitamin D (OSCAL WITH D) 500-200 MG-UNIT tablet Take 1 tablet by mouth 2 (two) times daily.    [provider]  diltiazem (CARDIZEM CD) 180 MG 24 hr capsule Take 180 mg by mouth daily. 10/03/17   [provider]  DULoxetine (CYMBALTA) 60 MG capsule Take 60 mg by mouth 2 (two) times daily.    [provider]  folic acid (FOLVITE) 1 MG tablet Take 1 mg by mouth daily.    [provider]  hydrALAZINE (APRESOLINE) 100 MG tablet Take 100 mg by mouth 3 (three) times daily.    [provider]  HYDROcodone-acetaminophen (NORCO) 5-325 MG tablet Take 1 tablet by mouth every 4 (four) hours as needed for moderate pain. 04/15/18   Franky Macho, MD  levothyroxine (SYNTHROID) 75 MCG tablet TAKE 1 TABLET BY MOUTH DAILY BEFORE BREAKFAST 01/25/21   Roma Kayser, MD  meloxicam (MOBIC) 15 MG tablet Take 15 mg by mouth daily.    [provider]  potassium chloride SA (K-DUR,KLOR-CON) 20 MEQ tablet Take 20 mEq by mouth 2 (two) times daily.    [provider]  predniSONE (DELTASONE) 5 MG tablet Take 5 mg by mouth daily as needed. For rheumatoid arthritis flare ups 01/21/18   [provider]  propranolol (INDERAL) 40 MG tablet Take 60 mg by mouth 2 (two) times daily.    [provider]  QUEtiapine (SEROQUEL) 25 MG tablet Take 1 tablet by mouth at bedtime. 12/30/20   [provider]  QULIPTA 60 MG TABS Take 1 tablet by mouth at bedtime. 12/30/20   [provider]  ranitidine (ZANTAC) 150 MG tablet Take 150 mg by mouth 2 (two) times daily. Patient not taking: Reported on 01/06/2021    [provider]  tiZANidine (ZANAFLEX) 4 MG tablet Take 4 mg by mouth See admin instructions. Take 1 tablet (4 mg) in the morning & 2 tablets (8  mg) at bedtime. 01/21/18   [provider]  UBRELVY 100 MG TABS Take 1 tablet by mouth daily as needed. 12/16/20   [provider]    Physical Exam: Vitals:   05/17/21 2145 05/17/21 2200 05/17/21 2230 05/17/21 2330  BP:  (!) 193/95 (!) 170/90 (!) 175/94  Pulse:  84 86 83  Resp:  17 (!) 24 (!) 23  Temp:      SpO2: 98% 98% 99% 94%  Weight:      Height:       Constitutional: NAD, calm, comfortable Eyes: PERRL, lids and conjunctivae normal ENMT: Mucous membranes are moist. Posterior pharynx clear of any exudate or lesions. Neck: normal, supple, no masses, no thyromegaly Respiratory: clear to auscultation bilaterally, no wheezing, no crackles. Normal respiratory effort. No accessory muscle use.  Cardiovascular: Regular rate and rhythm, no murmurs / rubs / gallops. No extremity edema. 2+ pedal pulses. No carotid bruits.  Abdomen: No distention.  Bowel sounds positive.  Soft, no tenderness, no masses palpated. No hepatosplenomegaly.  Musculoskeletal: no clubbing / cyanosis.  Good ROM, no contractures. Normal muscle tone.  Skin: no acute rashes, lesions, ulcers on very limited dermatological examination. Neurologic: Mild right facial droop, otherwise CN 2-12 grossly intact. Sensation intact, DTR normal. Strength 4/5 right-sided weakness.  Positive pronator right upper extremity drift. Psychiatric: Normal judgment and insight. Alert and oriented x 3. Normal mood.   Labs on Admission: I have personally reviewed following labs and imaging studies  CBC: Recent Labs  Lab 05/17/21 1941  WBC 9.9  NEUTROABS 5.4  HGB 14.5  HCT 43.0  MCV 94.9  PLT 459*    Basic Metabolic Panel: Recent Labs  Lab 05/17/21 1941  NA 135  K 2.3*  CL 99  CO2 28  GLUCOSE 97  BUN 11  CREATININE 0.74  CALCIUM 9.1  MG 2.0    GFR: Estimated Creatinine Clearance: 63.3 mL/min (by C-G formula based on SCr of 0.74 mg/dL).  Liver Function Tests: Recent Labs  Lab 05/17/21 1941  AST 20  ALT  17  ALKPHOS 75  BILITOT 2.4*  PROT 8.5*  ALBUMIN 4.2    Urine analysis: No results found for: COLORURINE, APPEARANCEUR, LABSPEC, PHURINE, GLUCOSEU, HGBUR, BILIRUBINUR, KETONESUR, PROTEINUR, UROBILINOGEN, NITRITE, LEUKOCYTESUR  Radiological Exams on Admission: DG Chest Portable 1 View  Result Date: 05/17/2021 CLINICAL DATA:  Chest pain.  Right-sided numbness. EXAM: PORTABLE CHEST 1 VIEW COMPARISON:  CT abdomen pelvis 06/03/2020 FINDINGS: The heart and mediastinal contours are within normal limits. Persistent right base linear atelectasis versus scarring. no focal consolidation. No pulmonary edema. Nonspecific blunting of the right costophrenic angle with no definite pleural effusion. No pneumothorax. No acute osseous abnormality. IMPRESSION: No active disease. Electronically Signed   By: Tish Frederickson M.D.   On: 05/17/2021 20:46    EKG: Independently reviewed.  Vent. rate 90 BPM PR interval 171 ms QRS duration 88 ms QT/QTcB 396/485 ms P-R-T axes 54 -6 76 Sinus rhythm Abnormal R-wave progression, early transition Borderline T wave abnormalities  Assessment/Plan Principal Problem:   Acute right-sided weakness Observation/telemetry. Frequent neurochecks. Swallow screen. Continue aspirin 81 mg daily. Continue atorvastatin 10 mg p.o. daily. Check fasting lipids and hemoglobin A1c. Check carotid Doppler and echocardiogram. Check MRI with and without contrast. Consult neurology in AM.  Active Problems:   Hyperbilirubinemia Recheck bilirubin and LFTs in AM.    Postsurgical hypothyroidism Continue levothyroxine 75 mcg p.o. daily.    GERD (gastroesophageal reflux disease) Begin famotidine 20 mg p.o. twice daily.    Depression Continue duloxetine 60 mg p.o. twice daily.    Hypertension Allow permissive hypertension. Hold regular antihypertensives. Monitor BP.    Hypokalemia Replacing. Magnesium was supplemented. Follow-up potassium level.    Thrombocytosis Follow  platelet levels.    Aortic atherosclerosis Continue atorvastatin 10 mg p.o. daily    DVT prophylaxis: Lovenox SQ. Code Status:   Full code. Family Communication:   Disposition Plan:   Patient is from:  Home.  Anticipated DC to:  Home.  Anticipated DC date:  05/19/2021.  Anticipated DC barriers: Clinical status/pending work-up Consults called:  Routine neurology consult. Admission status:  Observation/telemetry.   Severity of Illness: High severity after presenting with right-sided weakness since the previous day's evening.  The patient will remain for CVA work-up.  Bobette Mo MD [facility]  How to contact the [facility] Attending or Consulting provider 7A - 7P or covering provider during after hours 7P -7A,  for this patient?   Check the care team in [facility] and look for a) attending/consulting [facility] provider listed and b) the [facility] team listed Log into [email or web address] and use 's universal password to access. If you do not have the password, please contact the hospital operator. Locate the [facility] provider you are looking for under [facility] and page to a number that you can be directly reached. If you still have difficulty reaching the provider, please page the [facility] (Director on Call) for the Hospitalists listed on amion for assistance.  05/17/2021, 11:40 PM   This document was prepared using [employer] voice recognition software and may contain some unintended transcription errors.

## 2021-05-18 ENCOUNTER — Observation Stay (HOSPITAL_COMMUNITY): Payer: Medicare HMO

## 2021-05-18 ENCOUNTER — Observation Stay (HOSPITAL_BASED_OUTPATIENT_CLINIC_OR_DEPARTMENT_OTHER): Payer: Medicare HMO

## 2021-05-18 DIAGNOSIS — I10 Essential (primary) hypertension: Secondary | ICD-10-CM

## 2021-05-18 DIAGNOSIS — I700 Atherosclerosis of aorta: Secondary | ICD-10-CM

## 2021-05-18 DIAGNOSIS — R200 Anesthesia of skin: Secondary | ICD-10-CM | POA: Diagnosis present

## 2021-05-18 DIAGNOSIS — K219 Gastro-esophageal reflux disease without esophagitis: Secondary | ICD-10-CM

## 2021-05-18 DIAGNOSIS — Z79899 Other long term (current) drug therapy: Secondary | ICD-10-CM | POA: Diagnosis not present

## 2021-05-18 DIAGNOSIS — E890 Postprocedural hypothyroidism: Secondary | ICD-10-CM

## 2021-05-18 DIAGNOSIS — F32A Depression, unspecified: Secondary | ICD-10-CM

## 2021-05-18 DIAGNOSIS — E039 Hypothyroidism, unspecified: Secondary | ICD-10-CM | POA: Diagnosis not present

## 2021-05-18 DIAGNOSIS — Z7982 Long term (current) use of aspirin: Secondary | ICD-10-CM | POA: Diagnosis not present

## 2021-05-18 DIAGNOSIS — E876 Hypokalemia: Secondary | ICD-10-CM | POA: Diagnosis not present

## 2021-05-18 DIAGNOSIS — G43909 Migraine, unspecified, not intractable, without status migrainosus: Secondary | ICD-10-CM | POA: Diagnosis not present

## 2021-05-18 DIAGNOSIS — Z20822 Contact with and (suspected) exposure to covid-19: Secondary | ICD-10-CM | POA: Diagnosis not present

## 2021-05-18 LAB — COMPREHENSIVE METABOLIC PANEL
ALT: 14 U/L (ref 0–44)
AST: 13 U/L — ABNORMAL LOW (ref 15–41)
Albumin: 3.6 g/dL (ref 3.5–5.0)
Alkaline Phosphatase: 64 U/L (ref 38–126)
Anion gap: 6 (ref 5–15)
BUN: 8 mg/dL (ref 8–23)
CO2: 26 mmol/L (ref 22–32)
Calcium: 8.1 mg/dL — ABNORMAL LOW (ref 8.9–10.3)
Chloride: 106 mmol/L (ref 98–111)
Creatinine, Ser: 0.65 mg/dL (ref 0.44–1.00)
GFR, Estimated: >60 mL/min (ref >60)
Glucose, Bld: 92 mg/dL (ref 70–99)
Potassium: 3.4 mmol/L — ABNORMAL LOW (ref 3.5–5.1)
Sodium: 138 mmol/L (ref 135–145)
Total Bilirubin: 2.1 mg/dL — ABNORMAL HIGH (ref 0.3–1.2)
Total Protein: 7.5 g/dL (ref 6.5–8.1)

## 2021-05-18 LAB — LIPID PANEL
Cholesterol: 147 mg/dL (ref 0–200)
HDL: 51 mg/dL (ref >40)
LDL Cholesterol: 82 mg/dL (ref 0–99)
Total CHOL/HDL Ratio: 2.9 RATIO
Triglycerides: 69 mg/dL (ref <150)
VLDL: 14 mg/dL (ref 0–40)

## 2021-05-18 LAB — CBC WITH DIFFERENTIAL/PLATELET
Abs Immature Granulocytes: 0.02 10*3/uL (ref 0.00–0.07)
Basophils Absolute: 0.1 10*3/uL (ref 0.0–0.1)
Basophils Relative: 1 %
Eosinophils Absolute: 0.1 10*3/uL (ref 0.0–0.5)
Eosinophils Relative: 1 %
HCT: 41.3 % (ref 36.0–46.0)
Hemoglobin: 13.5 g/dL (ref 12.0–15.0)
Immature Granulocytes: 0 %
Lymphocytes Relative: 28 %
Lymphs Abs: 2.3 10*3/uL (ref 0.7–4.0)
MCH: 31.5 pg (ref 26.0–34.0)
MCHC: 32.7 g/dL (ref 30.0–36.0)
MCV: 96.3 fL (ref 80.0–100.0)
Monocytes Absolute: 0.7 10*3/uL (ref 0.1–1.0)
Monocytes Relative: 8 %
Neutro Abs: 5 10*3/uL (ref 1.7–7.7)
Neutrophils Relative %: 62 %
Platelets: 378 10*3/uL (ref 150–400)
RBC: 4.29 MIL/uL (ref 3.87–5.11)
RDW: 12.8 % (ref 11.5–15.5)
WBC: 8 10*3/uL (ref 4.0–10.5)
nRBC: 0 % (ref 0.0–0.2)

## 2021-05-18 LAB — BASIC METABOLIC PANEL
Anion gap: 9 (ref 5–15)
BUN: 6 mg/dL — ABNORMAL LOW (ref 8–23)
CO2: 21 mmol/L — ABNORMAL LOW (ref 22–32)
Calcium: 8.3 mg/dL — ABNORMAL LOW (ref 8.9–10.3)
Chloride: 106 mmol/L (ref 98–111)
Creatinine, Ser: 0.49 mg/dL (ref 0.44–1.00)
GFR, Estimated: >60 mL/min (ref >60)
Glucose, Bld: 98 mg/dL (ref 70–99)
Potassium: 2.7 mmol/L — CL (ref 3.5–5.1)
Sodium: 136 mmol/L (ref 135–145)

## 2021-05-18 LAB — CBG MONITORING, ED
Glucose-Capillary: 96 mg/dL (ref 70–99)
Glucose-Capillary: 96 mg/dL (ref 70–99)

## 2021-05-18 LAB — HEMOGLOBIN A1C
Hgb A1c MFr Bld: 5.3 % (ref 4.8–5.6)
Mean Plasma Glucose: 105.41 mg/dL

## 2021-05-18 LAB — C-REACTIVE PROTEIN: CRP: 1 mg/dL — ABNORMAL HIGH (ref <1.0)

## 2021-05-18 LAB — ECHOCARDIOGRAM COMPLETE
Area-P 1/2: 4.01 cm2
Height: 62 in
S' Lateral: 2.7 cm
Weight: 2464 oz

## 2021-05-18 LAB — RESP PANEL BY RT-PCR (FLU A&B, COVID) ARPGX2
Influenza A by PCR: NEGATIVE
Influenza B by PCR: NEGATIVE
SARS Coronavirus 2 by RT PCR: NEGATIVE

## 2021-05-18 LAB — HIV ANTIBODY (ROUTINE TESTING W REFLEX): HIV Screen 4th Generation wRfx: NONREACTIVE

## 2021-05-18 LAB — MAGNESIUM: Magnesium: 2.4 mg/dL (ref 1.7–2.4)

## 2021-05-18 LAB — SEDIMENTATION RATE: Sed Rate: 45 mm/hr — ABNORMAL HIGH (ref 0–22)

## 2021-05-18 MED ORDER — PROPRANOLOL HCL 20 MG PO TABS
60.0000 mg | ORAL_TABLET | Freq: Two times a day (BID) | ORAL | Status: DC
  Administered 2021-05-18 – 2021-05-19 (×3): 60 mg via ORAL
  Filled 2021-05-18: qty 3
  Filled 2021-05-18: qty 6
  Filled 2021-05-18: qty 3

## 2021-05-18 MED ORDER — POTASSIUM CHLORIDE IN NACL 40-0.9 MEQ/L-% IV SOLN
INTRAVENOUS | Status: DC
  Filled 2021-05-18 (×3): qty 1000

## 2021-05-18 MED ORDER — POTASSIUM CHLORIDE CRYS ER 20 MEQ PO TBCR
40.0000 meq | EXTENDED_RELEASE_TABLET | ORAL | Status: AC
  Administered 2021-05-18 (×3): 40 meq via ORAL
  Filled 2021-05-18 (×3): qty 2

## 2021-05-18 MED ORDER — HYDRALAZINE HCL 20 MG/ML IJ SOLN
5.0000 mg | Freq: Once | INTRAMUSCULAR | Status: AC
  Administered 2021-05-18: 5 mg via INTRAVENOUS
  Filled 2021-05-18: qty 1

## 2021-05-18 MED ORDER — POTASSIUM CHLORIDE IN NACL 40-0.9 MEQ/L-% IV SOLN
INTRAVENOUS | Status: DC
  Filled 2021-05-18: qty 1000

## 2021-05-18 MED ORDER — ENSURE ENLIVE PO LIQD
237.0000 mL | Freq: Two times a day (BID) | ORAL | Status: DC
  Administered 2021-05-19 (×2): 237 mL via ORAL

## 2021-05-18 MED ORDER — CLOPIDOGREL BISULFATE 75 MG PO TABS
75.0000 mg | ORAL_TABLET | Freq: Every day | ORAL | Status: DC
  Administered 2021-05-18 – 2021-05-19 (×2): 75 mg via ORAL
  Filled 2021-05-18 (×2): qty 1

## 2021-05-18 MED ORDER — GADOBUTROL 1 MMOL/ML IV SOLN
7.0000 mL | Freq: Once | INTRAVENOUS | Status: AC | PRN
  Administered 2021-05-18: 7 mL via INTRAVENOUS

## 2021-05-18 MED ORDER — DEXTROSE 50 % IV SOLN: 12.5000 g | INTRAVENOUS | Status: DC | PRN

## 2021-05-18 NOTE — Progress Notes (Signed)
PROGRESS NOTE    Bridget Woods  BJS:[id number] DOB: [birth year] DOA: 05/17/2021 PCP: Virl Diamond, MD    Chief Complaint  Patient presents with   Weakness    Brief Narrative:  Patient 65 year old female history of anxiety, depression, rheumatoid arthritis, fibromyalgia, GERD, headaches, hypertension, hypothyroidism status post thyroidectomy and postsurgical hypothyroidism presented to the ED with complaints of right-sided weakness on awakening around 6:30 AM on the morning of admission with LK W Tuesday evening the day before admission.  Patient describes frontal headache, visual disturbance with difficulty keeping the right eye open and some difficulty ambulating which was slowly improving.  Patient seen in the ED concern for acute CVA and patient admitted for stroke work-up.   Assessment & Plan:   Principal Problem:   Acute right-sided weakness Active Problems:   Postsurgical hypothyroidism   GERD (gastroesophageal reflux disease)   Depression   Hypertension   Hypokalemia   Thrombocytosis   Hyperbilirubinemia   Aortic atherosclerosis (HCC)   #1 acute right-sided weakness -Concern for acute CVA versus complex migraine. -CT angiogram head and neck done negative for any acute abnormalities or large vessel occlusion. -2D echo done with normal EF, no source of emboli noted. -MRI brain done negative for any acute abnormalities. -Carotid Dopplers with no hemodynamically significant stenosis. -PT/OT pending. -Patient noted to be on aspirin prior to admission we will add Plavix for now pending formal neurology evaluation. -Permissive hypertension. -Supportive care.  Neurology consulted.  2.  Hypertension -Concern for possible acute CVA and as such we will allow permissive hypertension for now pending neurology evaluation. -Resume home regimen propranolol. -IV hydralazine as needed. -Could likely start resuming home regimen antihypertensive medications in the next 24  hours if ongoing clinical improvement.  3.  Hypokalemia -K. Dur 40 mEq p.o. every 4 hours x3 doses. -Repeat labs in the morning.  4.  Postsurgical hypothyroidism -Continue home regimen Synthroid.  5.  Depression Cymbalta.  6.  GERD Continue Pepcid.  7.  Aortic atherosclerosis -Continue statin.  8.  Hyperbilirubinemia -Bilirubin levels trending down. -Repeat labs in the morning.   DVT prophylaxis: Lovenox Code Status: Full Family Communication: Updated patient and husband at bedside. Disposition:   Status is: Observation  The patient remains OBS appropriate and will d/c before 2 midnights.  Dispo: The patient is from: Home              Anticipated d/c is to: Home              Patient currently is not medically stable to d/c.   Difficult to place patient No       Consultants:  Neurology pending  Procedures:  CT angiogram head and neck 8/31/202 MRI head pending 05/18/2021 Carotid Dopplers pending 05/18/2021 2D echo pending 05/18/2021  Antimicrobials:  None   Subjective: Patient laying on gurney.  States had right visual changes, right-sided headache and right upper extremity weakness on awakening yesterday morning around 6 AM.  Patient stated went to bed around 2 AM yesterday.  States some improvement with right upper extremity weakness and headache/neck pain.  Objective: Vitals:   05/18/21 0700 05/18/21 0730 05/18/21 0800 05/18/21 0830  BP: (!) 189/93 (!) 166/83 (!) 168/129 (!) 183/87  Pulse: 93 87 88 87  Resp: 18 17 15 20   Temp:      SpO2: 97% 97% 96% 98%  Weight:      Height:        Intake/Output Summary (Last 24 hours) at 05/18/2021 0956 Last  data filed at 05/18/2021 0943 Gross per 24 hour  Intake 1000 ml  Output --  Net 1000 ml   Filed Weights   05/17/21 1932  Weight: 69.9 kg    Examination:  General exam: Appears calm and comfortable  Respiratory system: Clear to auscultation. Respiratory effort normal. Cardiovascular system: S1 & S2  heard, RRR. No JVD, murmurs, rubs, gallops or clicks. No pedal edema. Gastrointestinal system: Abdomen is nondistended, soft and nontender. No organomegaly or masses felt. Normal bowel sounds heard. Central nervous system: Alert and oriented.  Right upper extremity weakness.  Cranial nerves II through XII grossly intact.  Sensation intact.  Right lower extremity weakness chronic.  No focal neurological deficits. Extremities: Symmetric 5 x 5 power. Skin: No rashes, lesions or ulcers Psychiatry: Judgement and insight appear normal. Mood & affect appropriate.     Data Reviewed: I have personally reviewed following labs and imaging studies  CBC: Recent Labs  Lab 05/17/21 1941  WBC 9.9  NEUTROABS 5.4  HGB 14.5  HCT 43.0  MCV 94.9  PLT 459*    Basic Metabolic Panel: Recent Labs  Lab 05/17/21 1941 05/18/21 0423  NA 135 138  K 2.3* 3.4*  CL 99 106  CO2 28 26  GLUCOSE 97 92  BUN 11 8  CREATININE 0.74 0.65  CALCIUM 9.1 8.1*  MG 2.0  --     GFR: Estimated Creatinine Clearance: 63.3 mL/min (by C-G formula based on SCr of 0.65 mg/dL).  Liver Function Tests: Recent Labs  Lab 05/17/21 1941 05/18/21 0423  AST 20 13*  ALT 17 14  ALKPHOS 75 64  BILITOT 2.4* 2.1*  PROT 8.5* 7.5  ALBUMIN 4.2 3.6    CBG: No results for input(s): GLUCAP in the last 168 hours.   Recent Results (from the past 240 hour(s))  Resp Panel by RT-PCR (Flu A&B, Covid) Nasopharyngeal Swab     Status: None   Collection Time: 05/17/21 10:00 PM   Specimen: Nasopharyngeal Swab; Nasopharyngeal(NP) swabs in vial transport medium  Result Value Ref Range Status   SARS Coronavirus 2 by RT PCR NEGATIVE NEGATIVE Final    Comment: (NOTE) SARS-CoV-2 target nucleic acids are NOT DETECTED.  The SARS-CoV-2 RNA is generally detectable in upper respiratory specimens during the acute phase of infection. The lowest concentration of SARS-CoV-2 viral copies this assay can detect is 138 copies/mL. A negative result  does not preclude SARS-Cov-2 infection and should not be used as the sole basis for treatment or other patient management decisions. A negative result may occur with  improper specimen collection/handling, submission of specimen other than nasopharyngeal swab, presence of viral mutation(s) within the areas targeted by this assay, and inadequate number of viral copies(<138 copies/mL). A negative result must be combined with clinical observations, patient history, and epidemiological information. The expected result is Negative.  Fact Sheet for Patients:  BloggerCourse.com  Fact Sheet for Healthcare Providers:  SeriousBroker.it  This test is no t yet approved or cleared by the [place] FDA and  has been authorized for detection and/or diagnosis of SARS-CoV-2 by FDA under an Emergency Use Authorization (EUA). This EUA will remain  in effect (meaning this test can be used) for the duration of the COVID-19 declaration under Section 564(b)(1) of the Act, 21 U.S.C.section 360bbb-3(b)(1), unless the authorization is terminated  or revoked sooner.       Influenza A by PCR NEGATIVE NEGATIVE Final   Influenza B by PCR NEGATIVE NEGATIVE Final    Comment: (NOTE) The  Xpert Xpress SARS-CoV-2/FLU/RSV plus assay is intended as an aid in the diagnosis of influenza from Nasopharyngeal swab specimens and should not be used as a sole basis for treatment. Nasal washings and aspirates are unacceptable for Xpert Xpress SARS-CoV-2/FLU/RSV testing.  Fact Sheet for Patients: BloggerCourse.comhttps://www.fda.gov/media/152166/download  Fact Sheet for Healthcare Providers: SeriousBroker.ithttps://www.fda.gov/media/152162/download  This test is not yet approved or cleared by the [place] FDA and has been authorized for detection and/or diagnosis of SARS-CoV-2 by FDA under an Emergency Use Authorization (EUA). This EUA will remain in effect (meaning this test can be used) for  the duration of the COVID-19 declaration under Section 564(b)(1) of the Act, 21 U.S.C. section 360bbb-3(b)(1), unless the authorization is terminated or revoked.  Performed at [facility], [address] [place], [place] [id number]          Radiology Studies: CT ANGIO HEAD NECK W WO CM  Result Date: 05/17/2021 CLINICAL DATA:  Right-sided numbness EXAM: CT ANGIOGRAPHY HEAD AND NECK TECHNIQUE: Multidetector CT imaging of the head and neck was performed using the standard protocol during bolus administration of intravenous contrast. Multiplanar CT image reconstructions and MIPs were obtained to evaluate the vascular anatomy. Carotid stenosis measurements (when applicable) are obtained utilizing NASCET criteria, using the distal internal carotid diameter as the denominator. CONTRAST:  75mL OMNIPAQUE IOHEXOL 350 MG/ML SOLN COMPARISON:  None. FINDINGS: CT HEAD FINDINGS Brain: There is no mass, hemorrhage or extra-axial collection. The size and configuration of the ventricles and extra-axial CSF spaces are normal. There is no acute or chronic infarction. The brain parenchyma is normal. Skull: The visualized skull base, calvarium and extracranial soft tissues are normal. Sinuses/Orbits: No fluid levels or advanced mucosal thickening of the visualized paranasal sinuses. No mastoid or middle ear effusion. The orbits are normal. CTA NECK FINDINGS SKELETON: There is no bony spinal canal stenosis. No lytic or blastic lesion. OTHER NECK: Normal pharynx, larynx and major salivary glands. No cervical lymphadenopathy. Right hemi thyroidectomy. UPPER CHEST: No pneumothorax or pleural effusion. No nodules or masses. AORTIC ARCH: There is calcific atherosclerosis of the aortic arch. There is no aneurysm, dissection or hemodynamically significant stenosis of the visualized portion of the aorta. Conventional 3 vessel aortic branching pattern. The visualized proximal subclavian arteries are widely patent. RIGHT CAROTID  SYSTEM: Normal without aneurysm, dissection or stenosis. LEFT CAROTID SYSTEM: Normal without aneurysm, dissection or stenosis. VERTEBRAL ARTERIES: Left dominant configuration. Both origins are clearly patent. There is no dissection, occlusion or flow-limiting stenosis to the skull base (V1-V3 segments). CTA HEAD FINDINGS POSTERIOR CIRCULATION: --Vertebral arteries: Normal V4 segments. --Inferior cerebellar arteries: Normal. --Basilar artery: Normal. --Superior cerebellar arteries: Normal. --Posterior cerebral arteries (PCA): Normal. 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 --Anterior cerebral arteries (ACA): Normal. Both A1 segments are present. Patent anterior communicating artery (a-comm). --Middle cerebral arteries (MCA): Normal. VENOUS SINUSES: As permitted by contrast timing, patent. ANATOMIC VARIANTS: None Review of the MIP images confirms the above findings. IMPRESSION: 1. No intracranial arterial occlusion or high-grade stenosis. 2. 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 Aortic Atherosclerosis (ICD10-I70.0). Electronically Signed   By: Deatra RobinsonKevin  Herman M.D.   On: 05/17/2021 22:43   MR BRAIN W WO CONTRAST  Result Date: 05/18/2021 CLINICAL DATA:  Neuro deficit, acute, stroke suspected; right-sided weakness EXAM: MRI HEAD WITHOUT AND WITH CONTRAST TECHNIQUE: Multiplanar, multiecho pulse sequences of  the brain and surrounding structures were obtained without and with intravenous contrast. CONTRAST:  13mL GADAVIST GADOBUTROL 1 MMOL/ML IV SOLN COMPARISON:  None. FINDINGS: Brain: There is no acute infarction or intracranial hemorrhage. There is no intracranial mass, mass effect, or edema. There is no hydrocephalus or extra-axial fluid collection. Ventricles and sulci are normal in size and configuration. Minimal small  foci of T2 hyperintensity in the supratentorial white matter are nonspecific but may reflect minor chronic microvascular ischemic changes. Incidental note is made of a small left frontal developmental venous anomaly. Vascular: Major vessel flow voids at the skull base are preserved. Skull and upper cervical spine: Normal marrow signal is preserved. Sinuses/Orbits: Paranasal sinuses are aerated. Orbits are unremarkable. Other: Sella is partially empty.  Mastoid air cells are clear. IMPRESSION: No evidence of recent infarction, hemorrhage, or mass. Minor chronic microvascular ischemic changes. Electronically Signed   By: Guadlupe Spanish M.D.   On: 05/18/2021 09:17   DG Chest Portable 1 View  Result Date: 05/17/2021 CLINICAL DATA:  Chest pain.  Right-sided numbness. EXAM: PORTABLE CHEST 1 VIEW COMPARISON:  CT abdomen pelvis 06/03/2020 FINDINGS: The heart and mediastinal contours are within normal limits. Persistent right base linear atelectasis versus scarring. no focal consolidation. No pulmonary edema. Nonspecific blunting of the right costophrenic angle with no definite pleural effusion. No pneumothorax. No acute osseous abnormality. IMPRESSION: No active disease. Electronically Signed   By: Tish Frederickson M.D.   On: 05/17/2021 20:46        Scheduled Meds:   stroke: mapping our early stages of recovery book   Does not apply Once   aspirin EC  81 mg Oral QHS   Atogepant  1 tablet Oral QHS   atorvastatin  10 mg Oral Daily   DULoxetine  60 mg Oral BID   enoxaparin (LOVENOX) injection  40 mg Subcutaneous Q24H   famotidine  20 mg Oral BID   folic acid  1 mg Oral Daily   levothyroxine  75 mcg Oral Q0600   QUEtiapine  25 mg Oral QHS   Continuous Infusions:  0.9 % NaCl with KCl 40 mEq / L 100 mL/hr at 05/18/21 0942     LOS: 0 days    Time spent: 40 minutes    Ramiro Harvest, MD [facility]   To contact the attending provider between 7A-7P or the covering provider during after  hours 7P-7A, please log into the web site [email or web address] and access using universal [place] password for that web site. If you do not have the password, please call the hospital operator.  05/18/2021, 9:56 AM

## 2021-05-18 NOTE — Progress Notes (Signed)
  Echocardiogram 2D Echocardiogram has been performed.  Bridget Woods 05/18/2021, 11:33 AM

## 2021-05-18 NOTE — TOC Initial Note (Signed)
Transition of Care [facility]) - Initial/Assessment Note    Patient Details  Name: Bridget Woods MRN: [id number] Date of Birth: 08/29/1956  Transition of Care [facility]) CM/SW Contact:    Villa Herb, LCSWA Phone Number: 05/18/2021, 2:49 PM  Clinical Narrative:                 TOC updated that PT and OT are recommending [facility] PT/OT for pt. CSW spoke with pt and husband in ED room to complete assessment. Pt states that she lives with her husband and three boys. Pt is independent in completing her ADLs and drives some and has transportation when needed. Pt has not had HH services in the past. Pt has a cane to use when needed.   CSW informed pt of PT/OT recommendation for home services. Pt is not interested in home services but is agreeable to outpatient services. Pt is agreeable to outpatient services in [place]. CSW to make outpatient referral. TOC to follow.   Expected Discharge Plan: OP Rehab Barriers to Discharge: Continued Medical Work up   Patient Goals and CMS Choice Patient states their goals for this hospitalization and ongoing recovery are:: return home CMS Medicare.gov Compare Post Acute Care list provided to:: Patient Choice offered to / list presented to : Patient  Expected Discharge Plan and Services Expected Discharge Plan: OP Rehab In-house Referral: Clinical Social Work Discharge Planning Services: CM Consult   Living arrangements for the past 2 months: Single Family Home                                      Prior Living Arrangements/Services Living arrangements for the past 2 months: Single Family Home Lives with:: Spouse, Adult Children Patient language and need for interpreter reviewed:: Yes Do you feel safe going back to the place where you live?: Yes      Need for Family Participation in Patient Care: Yes (Comment) Care giver support system in place?: Yes (comment) Current home services: DME (cane) Criminal Activity/Legal Involvement Pertinent to  Current Situation/Hospitalization: No - Comment as needed  Activities of Daily Living      Permission Sought/Granted                  Emotional Assessment Appearance:: Appears stated age Attitude/Demeanor/Rapport: Engaged Affect (typically observed): Accepting Orientation: : Oriented to Self, Oriented to Place, Oriented to  Time, Oriented to Situation Alcohol / Substance Use: Not Applicable Psych Involvement: No (comment)  Admission diagnosis:  Acute right-sided weakness [R53.1] Patient Active Problem List   Diagnosis Date Noted   Aortic atherosclerosis (HCC) 05/18/2021   Acute right-sided weakness 05/17/2021   GERD (gastroesophageal reflux disease)    Depression    Hypertension    Hypokalemia    Thrombocytosis    Hyperbilirubinemia    Class 1 obesity due to excess calories without serious comorbidity with body mass index (BMI) of 30.0 to 30.9 in adult 01/06/2021   Postsurgical hypothyroidism 06/18/2018   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 [place] [address] Phone: [phone] Fax: [phone]  [facility] - [place], [place] -  [address] [place] [place] [address] Phone: [phone] Fax: [phone]     Social Determinants of Health (SDOH) Interventions    Readmission Risk Interventions No flowsheet data found.

## 2021-05-18 NOTE — Consult Note (Signed)
[facility] [name] A. Bridget Pilgrim, MD     [email or web address]          Bridget Woods is an 65 y.o. female.   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 It is unlikely that the symptoms are due to ischemia / TIA. As a result, will discontinue the Plavix. She can continue with aspirin. Additional labs will be obtained for sed rate and CRP To evaluate for unusual presentation of temporal arteritis. Think we should consider couple days of high-dose steroids however even if unremarkable.  Chronic daily migraine headaches well controlled with her current regimen using CGRP antagonist.   Poorly ontrol hypertension which potentially could contribute to the headaches.     The patient presents with multiple symptoms chief of which she has headaches. She reports the relatively acute onset of right temporal headaches associated with diaphoresis, dyspnea, drooping of the right eye dilated and right leg pain. She also had significant /marked right shoulder pain and right-sided neck pain. She was treated with medications in the emergency room which eventually helped. Symptoms lasted for several hours. She does have chronic right knee pain from arthritic changes. She does have a history of rheumatoid arthritis. The patient does have chronic migraine headaches but recently got on a regimen that has worked well for her. She tells me that she has really not had her typical migraine headaches in quite a while. The headaches are quite severe knocking her to the ground. They are different than her current headaches in that they occur typically bifrontal occipital and associated with visual scotoma and although visual obscuration. She does not report fevers or chills. She has been having diaphoresis particular of the facial region however. Her blood pressure  has been quite high with these spells and over last few days although she tells me her blood pressure typically is not well controlled. She is on aspirin and has been taking for several years. She tells me she had an episode of congestive heart failure and was told to be on this medication since then. The review systems otherwise negative.   GENERAL:  She is currently doing well at this time.  HEENT:  Mild soreness of the temple regions bilaterally somewhat more on the right side. Neck is supple. No trauma noted.  ABDOMEN: soft  EXTREMITIES: No edema; marked arthritic changes of the knees especially on the right side.   BACK: Normal  SKIN: Normal by inspection.    MENTAL STATUS: Alert and oriented. Speech, language and cognition are generally intact. Judgment and insight normal.   CRANIAL NERVES: Pupils are equal, round and reactive to light and accomodation; extra ocular movements are full, there is no significant nystagmus; visual fields are full; upper and lower facial muscles are normal in strength and symmetric, there is no flattening of the nasolabial folds; tongue is midline; uvula is midline; shoulder elevation is normal.  MOTOR: Normal tone, bulk and strength; no pronator drift.  COORDINATION: Left finger to nose is normal, right finger to nose is normal, No rest tremor; no intention tremor; no postural tremor; no bradykinesia.  REFLEXES: Deep tendon reflexes are symmetrical and normal. Plantar reflexes are flexor bilaterally.   SENSATION: Normal to light touch, temperature, and pain.    Blood pressure (!) 189/119, pulse 61, temperature 98.4 F (36.9 C), temperature source Oral, resp. rate 19, height  (1.575 m), weight 69.9 kg, SpO2 99 %.  Past Medical History:  Diagnosis Date   Anxiety    Arthritis    RA   Depression    Fibromyalgia    GERD (gastroesophageal reflux disease)    Headache    Hypertension    Hyperthyroidism     Past Surgical History:   Procedure Laterality Date   ABDOMINAL HYSTERECTOMY     BREAST SURGERY Left    removal of benign tumor   HERNIA REPAIR     Umbilical   THYROIDECTOMY N/A 04/14/2018   Procedure: TOTAL THYROIDECTOMY;  Surgeon: Franky Macho, MD;  Location: [facility];  Service: General;  Laterality: N/A;    Family History  Problem Relation Age of Onset   Seizures Mother    Prostate cancer Father    Thyroid cancer Brother    Breast cancer Paternal Aunt    Throat cancer Paternal Aunt     Social History:  reports that she has never smoked. She has never used smokeless tobacco. She reports that she does not currently use alcohol. She reports that she does not use drugs.  Allergies:  Allergies  Allergen Reactions   Naproxen Other (See Comments)    Contact Dermatitis--oral blisters   Ciprofibrate Nausea Only    Medications: Prior to Admission medications   Medication Sig Start Date End Date Taking? Authorizing Provider  ALPRAZolam Prudy Feeler) 0.5 MG tablet Take 0.5 mg by mouth 3 (three) times daily as needed for anxiety. 02/17/18  Yes [provider]  aspirin EC 81 MG tablet Take 81 mg by mouth at bedtime.    Yes [provider]  aspirin-acetaminophen-caffeine (EXCEDRIN MIGRAINE) 778 199 0400 MG tablet Take 2 tablets by mouth 2 (two) times daily as needed for headache or migraine.   Yes [provider]  atorvastatin (LIPITOR) 10 MG tablet Take 10 mg by mouth daily. 12/31/17  Yes [provider]  calcium-vitamin D (OSCAL WITH D) 500-200 MG-UNIT tablet Take 1 tablet by mouth 2 (two) times daily.   Yes [provider]  diltiazem (CARDIZEM CD) 180 MG 24 hr capsule Take 180 mg by mouth daily. 10/03/17  Yes [provider]  DULoxetine (CYMBALTA) 60 MG capsule Take 60 mg by mouth 2 (two) times daily.   Yes [provider]  ENBREL MINI 50 MG/ML SOCT Inject into the skin. 05/13/21  Yes [provider]  folic acid (FOLVITE) 1 MG tablet Take 1 mg by mouth  daily.   Yes [provider]  hydrALAZINE (APRESOLINE) 100 MG tablet Take 100 mg by mouth 3 (three) times daily.   Yes [provider]  HYDROcodone-acetaminophen (NORCO) 5-325 MG tablet Take 1 tablet by mouth every 4 (four) hours as needed for moderate pain. 04/15/18  Yes Franky Macho, MD  levothyroxine (SYNTHROID) 75 MCG tablet TAKE 1 TABLET BY MOUTH DAILY BEFORE BREAKFAST Patient taking differently: Take 75 mcg by mouth daily before breakfast. 01/25/21  Yes Nida, Denman George, MD  losartan (COZAAR) 50 MG tablet Take 50 mg by mouth daily. 05/16/21  Yes [provider]  meloxicam (MOBIC) 15 MG tablet Take 15 mg by mouth daily.   Yes [provider]  potassium chloride SA (K-DUR,KLOR-CON) 20 MEQ tablet Take 20 mEq by mouth 2 (two) times daily.   Yes [provider]  predniSONE (DELTASONE) 5 MG tablet Take 5 mg by mouth daily as needed. For rheumatoid arthritis flare ups 01/21/18  Yes [provider]  propranolol (INDERAL) 40 MG tablet Take 60 mg by mouth 2 (two) times daily.  Yes [provider]  QUEtiapine (SEROQUEL) 25 MG tablet Take 1 tablet by mouth at bedtime. 12/30/20  Yes [provider]  QULIPTA 60 MG TABS Take 1 tablet by mouth at bedtime. 12/30/20  Yes [provider]  UBRELVY 100 MG TABS Take 1 tablet by mouth daily as needed (headache). 12/16/20  Yes [provider]  alendronate (FOSAMAX) 70 MG tablet Take 70 mg by mouth every Sunday. Patient not taking: Reported on 05/18/2021 01/21/18   [provider]  amitriptyline (ELAVIL) 10 MG tablet at bedtime.  Patient not taking: No sig reported 11/04/18   [provider]  BELSOMRA 10 MG TABS Take 10 mg by mouth at bedtime. Patient not taking: No sig reported 02/05/18   [provider]  ranitidine (ZANTAC) 150 MG tablet Take 150 mg by mouth 2 (two) times daily. Patient not taking: No sig reported    [provider]  tiZANidine  (ZANAFLEX) 4 MG tablet Take 4 mg by mouth See admin instructions. Take 1 tablet (4 mg) in the morning & 2 tablets (8 mg) at bedtime. Patient not taking: Reported on 05/18/2021 01/21/18   [provider]    Scheduled Meds:   stroke: mapping our early stages of recovery book   Does not apply Once   aspirin EC  81 mg Oral QHS   Atogepant  1 tablet Oral QHS   atorvastatin  10 mg Oral Daily   clopidogrel  75 mg Oral Daily   DULoxetine  60 mg Oral BID   enoxaparin (LOVENOX) injection  40 mg Subcutaneous Q24H   famotidine  20 mg Oral BID   [START ON 05/19/2021] feeding supplement  237 mL Oral BID BM   folic acid  1 mg Oral Daily   levothyroxine  75 mcg Oral Q0600   potassium chloride  40 mEq Oral Q4H   propranolol  60 mg Oral BID   QUEtiapine  25 mg Oral QHS   Continuous Infusions:  0.9 % NaCl with KCl 40 mEq / L 100 mL/hr at 05/18/21 0942   PRN Meds:.acetaminophen **OR** acetaminophen, ALPRAZolam, dextrose, HYDROcodone-acetaminophen, ondansetron **OR** ondansetron (ZOFRAN) IV     Results for orders placed or performed during the hospital encounter of 05/17/21 (from the past 48 hour(s))  Comprehensive metabolic panel     Status: Abnormal   Collection Time: 05/17/21  7:41 PM  Result Value Ref Range   Sodium 135 135 - 145 mmol/L   Potassium 2.3 (LL) 3.5 - 5.1 mmol/L    Comment: CRITICAL RESULT CALLED TO, READ BACK BY AND VERIFIED WITH: WALKER,T ON 05/17/21 AT 2200 BY LOY,C    Chloride 99 98 - 111 mmol/L   CO2 28 22 - 32 mmol/L   Glucose, Bld 97 70 - 99 mg/dL    Comment: Glucose reference range applies only to samples taken after fasting for at least 8 hours.   BUN 11 8 - 23 mg/dL   Creatinine, Ser 8.29 0.44 - 1.00 mg/dL   Calcium 9.1 8.9 - 56.2 mg/dL   Total Protein 8.5 (H) 6.5 - 8.1 g/dL   Albumin 4.2 3.5 - 5.0 g/dL   AST 20 15 - 41 U/L   ALT 17 0 - 44 U/L   Alkaline Phosphatase 75 38 - 126 U/L   Total Bilirubin 2.4 (H) 0.3 - 1.2 mg/dL   GFR, Estimated >13 >08 mL/min     Comment: (NOTE) Calculated using the CKD-EPI Creatinine Equation (2021)    Anion gap 8 5 -  15    Comment: Performed at [facility], [address] [place], [place] [address]  CBC with Differential     Status: Abnormal   Collection Time: 05/17/21  7:41 PM  Result Value Ref Range   WBC 9.9 4.0 - 10.5 K/uL   RBC 4.53 3.87 - 5.11 MIL/uL   Hemoglobin 14.5 12.0 - 15.0 g/dL   HCT 86.5 78.4 - 69.6 %   MCV 94.9 80.0 - 100.0 fL   MCH 32.0 26.0 - 34.0 pg   MCHC 33.7 30.0 - 36.0 g/dL   RDW 29.5 28.4 - 13.2 %   Platelets 459 (H) 150 - 400 K/uL   nRBC 0.0 0.0 - 0.2 %   Neutrophils Relative % 54 %   Neutro Abs 5.4 1.7 - 7.7 K/uL   Lymphocytes Relative 37 %   Lymphs Abs 3.7 0.7 - 4.0 K/uL   Monocytes Relative 8 %   Monocytes Absolute 0.7 0.1 - 1.0 K/uL   Eosinophils Relative 0 %   Eosinophils Absolute 0.0 0.0 - 0.5 K/uL   Basophils Relative 1 %   Basophils Absolute 0.1 0.0 - 0.1 K/uL   Immature Granulocytes 0 %   Abs Immature Granulocytes 0.03 0.00 - 0.07 K/uL    Comment: Performed at [facility], [address], [place], [place] [address]  Protime-INR     Status: None   Collection Time: 05/17/21  7:41 PM  Result Value Ref Range   Prothrombin Time 12.5 11.4 - 15.2 seconds   INR 0.9 0.8 - 1.2    Comment: (NOTE) INR goal varies based on device and disease states. Performed at [facility], [address], [place], [place] [address]   Magnesium     Status: None   Collection Time: 05/17/21  7:41 PM  Result Value Ref Range   Magnesium 2.0 1.7 - 2.4 mg/dL    Comment: Performed at [facility], [address], [place], [place] [address]  Resp Panel by RT-PCR (Flu A&B, Covid) Nasopharyngeal Swab     Status: None   Collection Time: 05/17/21 10:00 PM   Specimen: Nasopharyngeal Swab; Nasopharyngeal(NP) swabs in vial transport medium  Result Value Ref Range   SARS Coronavirus 2 by RT PCR NEGATIVE NEGATIVE    Comment: (NOTE) SARS-CoV-2 target nucleic acids are NOT DETECTED.  The SARS-CoV-2 RNA  is generally detectable in upper respiratory specimens during the acute phase of infection. The lowest concentration of SARS-CoV-2 viral copies this assay can detect is 138 copies/mL. A negative result does not preclude SARS-Cov-2 infection and should not be used as the sole basis for treatment or other patient management decisions. A negative result may occur with  improper specimen collection/handling, submission of specimen other than nasopharyngeal swab, presence of viral mutation(s) within the areas targeted by this assay, and inadequate number of viral copies(<138 copies/mL). A negative result must be combined with clinical observations, patient history, and epidemiological information. The expected result is Negative.  Fact Sheet for Patients:  BloggerCourse.com  Fact Sheet for Healthcare Providers:  SeriousBroker.it  This test is no t yet approved or cleared by the [place] FDA and  has been authorized for detection and/or diagnosis of SARS-CoV-2 by FDA under an Emergency Use Authorization (EUA). This EUA will remain  in effect (meaning this test can be used) for the duration of the COVID-19 declaration under Section 564(b)(1) of the Act, 21 U.S.C.section 360bbb-3(b)(1), unless the authorization is terminated  or revoked sooner.       Influenza A  by PCR NEGATIVE NEGATIVE   Influenza B by PCR NEGATIVE NEGATIVE    Comment: (NOTE) The Xpert Xpress SARS-CoV-2/FLU/RSV plus assay is intended as an aid in the diagnosis of influenza from Nasopharyngeal swab specimens and should not be used as a sole basis for treatment. Nasal washings and aspirates are unacceptable for Xpert Xpress SARS-CoV-2/FLU/RSV testing.  Fact Sheet for Patients: BloggerCourse.com  Fact Sheet for Healthcare Providers: SeriousBroker.it  This test is not yet approved or cleared by the [place] FDA  and has been authorized for detection and/or diagnosis of SARS-CoV-2 by FDA under an Emergency Use Authorization (EUA). This EUA will remain in effect (meaning this test can be used) for the duration of the COVID-19 declaration under Section 564(b)(1) of the Act, 21 U.S.C. section 360bbb-3(b)(1), unless the authorization is terminated or revoked.  Performed at [facility], [address] [place], [place] [address]   Hemoglobin A1c     Status: None   Collection Time: 05/18/21  4:23 AM  Result Value Ref Range   Hgb A1c MFr Bld 5.3 4.8 - 5.6 %    Comment: (NOTE) Pre diabetes:          5.7%-6.4%  Diabetes:              >6.4%  Glycemic control for   <7.0% adults with diabetes    Mean Plasma Glucose 105.41 mg/dL    Comment: Performed at [facility], [address], [place], [place] [address]  Lipid panel     Status: None   Collection Time: 05/18/21  4:23 AM  Result Value Ref Range   Cholesterol 147 0 - 200 mg/dL   Triglycerides 69 <098 mg/dL   HDL 51 >11 mg/dL   Total CHOL/HDL Ratio 2.9 RATIO   VLDL 14 0 - 40 mg/dL   LDL Cholesterol 82 0 - 99 mg/dL    Comment:        Total Cholesterol/HDL:CHD Risk Coronary Heart Disease Risk Table                     Men   Women  1/2 Average Risk   3.4   3.3  Average Risk       5.0   4.4  2 X Average Risk   9.6   7.1  3 X Average Risk  23.4   11.0        Use the calculated Patient Ratio above and the CHD Risk Table to determine the patient's CHD Risk.        ATP III CLASSIFICATION (LDL):  <100     mg/dL   Optimal  914-782  mg/dL   Near or Above                    Optimal  130-159  mg/dL   Borderline  956-213  mg/dL   High  >086     mg/dL   Very High Performed at [facility] [place] [facility], [address] [place], [place] [address]   HIV Antibody (routine testing w rflx)     Status: None   Collection Time: 05/18/21  4:23 AM  Result Value Ref Range   HIV Screen 4th Generation wRfx Non Reactive Non Reactive    Comment: Performed at [facility], [address], [place], [place] [address]  Comprehensive metabolic panel     Status: Abnormal   Collection Time: 05/18/21  4:23 AM  Result Value Ref Range   Sodium  138 135 - 145 mmol/L   Potassium 3.4 (L) 3.5 - 5.1 mmol/L    Comment: DELTA CHECK NOTED   Chloride 106 98 - 111 mmol/L   CO2 26 22 - 32 mmol/L   Glucose, Bld 92 70 - 99 mg/dL    Comment: Glucose reference range applies only to samples taken after fasting for at least 8 hours.   BUN 8 8 - 23 mg/dL   Creatinine, Ser 1.610.65 0.44 - 1.00 mg/dL   Calcium 8.1 (L) 8.9 - 10.3 mg/dL   Total Protein 7.5 6.5 - 8.1 g/dL   Albumin 3.6 3.5 - 5.0 g/dL   AST 13 (L) 15 - 41 U/L   ALT 14 0 - 44 U/L   Alkaline Phosphatase 64 38 - 126 U/L   Total Bilirubin 2.1 (H) 0.3 - 1.2 mg/dL   GFR, Estimated >09>60 >60>60 mL/min    Comment: (NOTE) Calculated using the CKD-EPI Creatinine Equation (2021)    Anion gap 6 5 - 15    Comment: Performed at [facility], [address] [place], [place] [address]  Basic metabolic panel     Status: Abnormal   Collection Time: 05/18/21  9:51 AM  Result Value Ref Range   Sodium 136 135 - 145 mmol/L   Potassium 2.7 (LL) 3.5 - 5.1 mmol/L    Comment: CRITICAL RESULT CALLED TO, READ BACK BY AND VERIFIED WITH: HOLECOMB,R AT 10:25AM ON 05/18/21 BY FESTERMAN,C    Chloride 106 98 - 111 mmol/L   CO2 21 (L) 22 - 32 mmol/L   Glucose, Bld 98 70 - 99 mg/dL    Comment: Glucose reference range applies only to samples taken after fasting for at least 8 hours.   BUN 6 (L) 8 - 23 mg/dL   Creatinine, Ser 8.110.49 0.44 - 1.00 mg/dL   Calcium 8.3 (L) 8.9 - 10.3 mg/dL   GFR, Estimated >91>60 >47>60 mL/min    Comment: (NOTE) Calculated using the CKD-EPI Creatinine Equation (2021)    Anion gap 9 5 - 15    Comment: Performed at [facility], [address] [place], [place] [address]  CBC with Differential/Platelet     Status: None   Collection Time: 05/18/21  9:51 AM  Result Value Ref Range   WBC 8.0 4.0 - 10.5 K/uL   RBC 4.29  3.87 - 5.11 MIL/uL   Hemoglobin 13.5 12.0 - 15.0 g/dL   HCT 21.341.3 08.636.0 - 57.846.0 %   MCV 96.3 80.0 - 100.0 fL   MCH 31.5 26.0 - 34.0 pg   MCHC 32.7 30.0 - 36.0 g/dL   RDW 46.912.8 62.911.5 - 52.815.5 %   Platelets 378 150 - 400 K/uL   nRBC 0.0 0.0 - 0.2 %   Neutrophils Relative % 62 %   Neutro Abs 5.0 1.7 - 7.7 K/uL   Lymphocytes Relative 28 %   Lymphs Abs 2.3 0.7 - 4.0 K/uL   Monocytes Relative 8 %   Monocytes Absolute 0.7 0.1 - 1.0 K/uL   Eosinophils Relative 1 %   Eosinophils Absolute 0.1 0.0 - 0.5 K/uL   Basophils Relative 1 %   Basophils Absolute 0.1 0.0 - 0.1 K/uL   Immature Granulocytes 0 %   Abs Immature Granulocytes 0.02 0.00 - 0.07 K/uL    Comment: Performed at [facility], [address] [place], [place] [address]  Magnesium     Status: None   Collection Time: 05/18/21  9:51 AM  Result Value Ref Range   Magnesium 2.4  1.7 - 2.4 mg/dL    Comment: Performed at [facility], [address] [place], [place] [address]  CBG monitoring, ED     Status: None   Collection Time: 05/18/21  9:57 AM  Result Value Ref Range   Glucose-Capillary 96 70 - 99 mg/dL    Comment: Glucose reference range applies only to samples taken after fasting for at least 8 hours.  CBG monitoring, ED     Status: None   Collection Time: 05/18/21 12:46 PM  Result Value Ref Range   Glucose-Capillary 96 70 - 99 mg/dL    Comment: Glucose reference range applies only to samples taken after fasting for at least 8 hours.    Studies/Results:  HEAD NECK CTA IMPRESSION: 1. No intracranial arterial occlusion or high-grade stenosis. 2. 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     BRAIN MRI FINDINGS: Brain: There is no acute infarction or intracranial hemorrhage. There is no intracranial mass, mass effect, or edema. There is no hydrocephalus or extra-axial fluid collection. Ventricles and sulci are normal in size and configuration. Minimal small foci of  T2 hyperintensity in the supratentorial white matter are nonspecific but may reflect minor chronic microvascular ischemic changes. Incidental note is made of a small left frontal developmental venous anomaly.   Vascular: Major vessel flow voids at the skull base are preserved.   Skull and upper cervical spine: Normal marrow signal is preserved.   Sinuses/Orbits: Paranasal sinuses are aerated. Orbits are unremarkable.   Other: Sella is partially empty.  Mastoid air cells are clear.   IMPRESSION: No evidence of recent infarction, hemorrhage, or mass. Minor chronic microvascular ischemic changes.    CAROTID  IMPRESSION: No evidence of hemodynamically significant stenosis involving either the right or left carotid circulation in the neck by Doppler criteria. The visualized portions of carotid arteries in the neck are normal in caliber without significant stenosis         Venson Ferencz A. Bridget Woods, M.D.  Diplomate, Biomedical engineer of Psychiatry and Neurology ( Neurology). 05/18/2021, 6:43 PM

## 2021-05-18 NOTE — ED Notes (Signed)
Pts cbg was 96. RN notified.

## 2021-05-18 NOTE — Evaluation (Signed)
Physical Therapy Evaluation Patient Details Name: Bridget Woods MRN: [id number] DOB: 1956-03-10 Today's Date: 05/18/2021   History of Present Illness  Bridget Woods is a 65 y.o. female with medical history significant of anxiety, depression, rheumatoid arthritis, fibromyalgia, GERD, headaches, hypertension, hyperthyroidism with thyroidectomy and postsurgical hypothyroidism who is coming to the emergency department with complaints of right-sided weakness since she woke up around 6:30 in the morning.  LKW was Tuesday evening.  She has had a frontal headache with visual disturbance, felt that she was unable to keep her right eye open, has had some difficulty ambulating which have improved since then.  She also has not been eating much since Sunday and stated yesterday she was momentarily diaphoretic.  She denied fever, chills, sore throat, rhinorrhea, productive cough, wheezing, hemoptysis, dyspnea, chest pain, palpitations, PND, orthopnea or increasing pitting edema lower extremities.  No abdominal pain, nausea, emesis, diarrhea, constipation, melena or hematochezia.  No dysuria, frequency or hematuria.  No polyuria, polydipsia, polyphagia or blurred vision.   Clinical Impression  Patient functioning near baseline for functional mobility and gait other than c/o generalized weakness.  Patient has to lean on side rails in hallway or hand held assist during ambulation  without loss of balance.  Patient states at baseline she used quad-cane or lean on nearby objects for support and feel comfortable going home today with spouse to assist.  Plan:  Patient discharged from physical therapy to care of nursing for ambulation daily as tolerated for length of stay.     Follow Up Recommendations Home health PT    Equipment Recommendations  None recommended by PT    Recommendations for Other Services       Precautions / Restrictions Precautions Precautions: None Restrictions Weight Bearing Restrictions:  No      Mobility  Bed Mobility Overal bed mobility: Modified Independent                  Transfers                 General transfer comment: as OT notes  Ambulation/Gait Ambulation/Gait assistance: Min guard Gait Distance (Feet): 100 Feet Assistive device: 1 person hand held assist Gait Pattern/deviations: Decreased step length - right;Decreased step length - left;Decreased stride length Gait velocity: decreased   General Gait Details: slightly labored cadence having to lean on side rails or hand held assist  Stairs            Wheelchair Mobility    Modified Rankin (Stroke Patients Only)       Balance Overall balance assessment: Mild deficits observed, not formally tested                                           Pertinent Vitals/Pain Pain Assessment: 0-10 Pain Score: 6  Pain Location: R shoulder Pain Descriptors / Indicators: Sore Pain Intervention(s): Limited activity within patient's tolerance;Monitored during session;Repositioned    Home Living Family/patient expects to be discharged to:: Private residence Living Arrangements: Spouse/significant other Available Help at Discharge: Family;Available 24 hours/day Type of Home: House Home Access: Stairs to enter Entrance Stairs-Rails: Left Entrance Stairs-Number of Steps: 3 Home Layout: One level Home Equipment: Cane - quad      Prior Function Level of Independence: Independent with assistive device(s)         Comments: uses quad cane or leans on walls side rails  for household distances     Hand Dominance   Dominant Hand: Right    Extremity/Trunk Assessment   Upper Extremity Assessment Upper Extremity Assessment: Defer to OT evaluation    Lower Extremity Assessment Lower Extremity Assessment: Generalized weakness    Cervical / Trunk Assessment Cervical / Trunk Assessment: Normal  Communication   Communication: No difficulties  Cognition  Arousal/Alertness: Awake/alert Behavior During Therapy: WFL for tasks assessed/performed Overall Cognitive Status: Within Functional Limits for tasks assessed                                        General Comments      Exercises     Assessment/Plan    PT Assessment All further PT needs can be met in the next venue of care  PT Problem List Decreased strength;Decreased activity tolerance;Decreased balance;Decreased mobility       PT Treatment Interventions      PT Goals (Current goals can be found in the Care Plan section)  Acute Rehab PT Goals Patient Stated Goal: return home with spouse to assist PT Goal Formulation: With patient/family Time For Goal Achievement: 05/18/21 Potential to Achieve Goals: Good    Frequency     Barriers to discharge        Co-evaluation PT/OT/SLP Co-Evaluation/Treatment: Yes Reason for Co-Treatment: To address functional/ADL transfers PT goals addressed during session: Mobility/safety with mobility;Balance;Proper use of DME         AM-PAC PT "6 Clicks" Mobility  Outcome Measure Help needed turning from your back to your side while in a flat bed without using bedrails?: None Help needed moving from lying on your back to sitting on the side of a flat bed without using bedrails?: None Help needed moving to and from a bed to a chair (including a wheelchair)?: A Little Help needed standing up from a chair using your arms (e.g., wheelchair or bedside chair)?: A Little Help needed to walk in hospital room?: A Little Help needed climbing 3-5 steps with a railing? : A Little 6 Click Score: 20    End of Session   Activity Tolerance: Patient tolerated treatment well;Patient limited by fatigue Patient left: in bed;with call bell/phone within reach;with family/visitor present Nurse Communication: Mobility status PT Visit Diagnosis: Unsteadiness on feet (R26.81);Other abnormalities of gait and mobility (R26.89);Muscle weakness  (generalized) (M62.81)    Time: 9030-0923 PT Time Calculation (min) (ACUTE ONLY): 17 min   Charges:   PT Evaluation $PT Eval Low Complexity: 1 Low PT Treatments $Therapeutic Activity: 8-22 mins        2:26 PM, 05/18/21 Lonell Grandchild, MPT Physical Therapist with [facility] 336 [phone] office [phone] mobile phone

## 2021-05-18 NOTE — Evaluation (Signed)
Occupational Therapy Evaluation Patient Details Name: Bridget Woods MRN: [id number] DOB: [birth year] Today's Date: 05/18/2021    History of Present Illness Bridget Woods is a 65 y.o. female with medical history significant of anxiety, depression, rheumatoid arthritis, fibromyalgia, GERD, headaches, hypertension, hyperthyroidism with thyroidectomy and postsurgical hypothyroidism who is coming to the emergency department with complaints of right-sided weakness since she woke up around 6:30 in the morning.  LKW was Tuesday evening.  She has had a frontal headache with visual disturbance, felt that she was unable to keep her right eye open, has had some difficulty ambulating which have improved since then.  She also has not been eating much since Sunday and stated yesterday she was momentarily diaphoretic.  She denied fever, chills, sore throat, rhinorrhea, productive cough, wheezing, hemoptysis, dyspnea, chest pain, palpitations, PND, orthopnea or increasing pitting edema lower extremities.  No abdominal pain, nausea, emesis, diarrhea, constipation, melena or hematochezia.  No dysuria, frequency or hematuria.  No polyuria, polydipsia, polyphagia or blurred vision.   Clinical Impression   Pt agreeable to OT/PT co-evaluation. Pt demonstrates mild R side weakness compared to L UE despite R side being dominant UE. Pt requires single hand held assist and support from walls during functional ambulation and transfers. Pt reports leaning on walls within home at baseline. Pt was able to complete bed mobility with mild labored movement. Pt reported not being interested in home health, but this was recommended due to R side weakness. Pt is not recommended for further acute OT services. Pt will be discharged to care of nursing staff for the remainder of her stay.     Follow Up Recommendations  Home health OT    Equipment Recommendations  None recommended by OT           Precautions / Restrictions  Precautions Precautions: None Restrictions Weight Bearing Restrictions: No      Mobility Bed Mobility Overal bed mobility: Modified Independent             General bed mobility comments: somewhat labored movement    Transfers Overall transfer level: Needs assistance Equipment used: 1 person hand held assist Transfers: Sit to/from Stand;Stand Pivot Transfers Sit to Stand: Independent Stand pivot transfers: Min assist       General transfer comment: Single hand held assist with during functional ambulation in hall. Pt reports leaning on walls for support at baseline.    Balance Overall balance assessment: Mild deficits observed, not formally tested (Single hand held assist and leaning on walls.)                                         ADL either performed or assessed with clinical judgement   ADL Overall ADL's : Needs assistance/impaired                         Toilet Transfer: Minimal assistance;Ambulation Toilet Transfer Details (indicate cue type and reason): Simulated via ambulation in hall from bed and back. Pt required single hand held assist. Pt also reports leaning on walls at home during mobility at baseline.                 Vision Baseline Vision/History: 1 Wears glasses Ability to See in Adequate Light: 0 Adequate Patient Visual Report: No change from baseline Vision Assessment?: No apparent visual deficits (Per observation of functional tasks.)  Pertinent Vitals/Pain Pain Assessment: 0-10 Pain Score: 6  Pain Location: R shoulder Pain Descriptors / Indicators: Sore Pain Intervention(s): Monitored during session;Repositioned     Hand Dominance Right   Extremity/Trunk Assessment Upper Extremity Assessment Upper Extremity Assessment: RUE deficits/detail RUE Deficits / Details: Pt demonstrates weakness in R UE of ~4/5 MMT. L UE was demonstrated at 5/5 indicating some possible R side weakness. RUE  Coordination: WNL   Lower Extremity Assessment Lower Extremity Assessment: Defer to PT evaluation   Cervical / Trunk Assessment Cervical / Trunk Assessment: Normal   Communication Communication Communication: No difficulties   Cognition Arousal/Alertness: Awake/alert Behavior During Therapy: WFL for tasks assessed/performed Overall Cognitive Status: Within Functional Limits for tasks assessed                                                      Home Living Family/patient expects to be discharged to:: Private residence Living Arrangements: Spouse/significant other Available Help at Discharge: Family;Available 24 hours/day Type of Home: House Home Access: Stairs to enter [employer] of Steps: 3 Entrance Stairs-Rails: Left (going up) Home Layout: One level     Bathroom Shower/Tub: Producer, television/film/video: Handicapped height Bathroom Accessibility: Yes   Home Equipment: Cane - quad          Prior Functioning/Environment Level of Independence: Independent with assistive device(s)        Comments: Used cane PRN for functional mobility.        OT Problem List: Decreased strength;Impaired balance (sitting and/or standing)      OT Treatment/Interventions:      OT Goals(Current goals can be found in the care plan section) Acute Rehab OT Goals Patient Stated Goal: return home                   Co-evaluation PT/OT/SLP Co-Evaluation/Treatment: Yes Reason for Co-Treatment: To address functional/ADL transfers   OT goals addressed during session: ADL's and self-care;Strengthening/ROM      AM-PAC OT "6 Clicks" Daily Activity     Outcome Measure Help from another person eating meals?: None Help from another person taking care of personal grooming?: None Help from another person toileting, which includes using toliet, bedpan, or urinal?: None Help from another person bathing (including washing, rinsing, drying)?:  None Help from another person to put on and taking off regular upper body clothing?: None Help from another person to put on and taking off regular lower body clothing?: None 6 Click Score: 24   End of Session    Activity Tolerance: Patient tolerated treatment well Patient left: in bed;with family/visitor present  OT Visit Diagnosis: Unsteadiness on feet (R26.81);Muscle weakness (generalized) (M62.81)                Time: 6389-3734 OT Time Calculation (min): 12 min Charges:  OT General Charges $OT Visit: 1 Visit OT Evaluation $OT Eval Low Complexity: 1 Low  Masiya Claassen OT, MOT  Danie Chandler 05/18/2021, 10:04 AM

## 2021-05-19 DIAGNOSIS — G43909 Migraine, unspecified, not intractable, without status migrainosus: Secondary | ICD-10-CM

## 2021-05-19 LAB — CBC WITH DIFFERENTIAL/PLATELET
Abs Immature Granulocytes: 0.03 10*3/uL (ref 0.00–0.07)
Basophils Absolute: 0.1 10*3/uL (ref 0.0–0.1)
Basophils Relative: 1 %
Eosinophils Absolute: 0.2 10*3/uL (ref 0.0–0.5)
Eosinophils Relative: 2 %
HCT: 39.9 % (ref 36.0–46.0)
Hemoglobin: 12.5 g/dL (ref 12.0–15.0)
Immature Granulocytes: 0 %
Lymphocytes Relative: 35 %
Lymphs Abs: 3.4 10*3/uL (ref 0.7–4.0)
MCH: 31.6 pg (ref 26.0–34.0)
MCHC: 31.3 g/dL (ref 30.0–36.0)
MCV: 100.8 fL — ABNORMAL HIGH (ref 80.0–100.0)
Monocytes Absolute: 0.9 10*3/uL (ref 0.1–1.0)
Monocytes Relative: 10 %
Neutro Abs: 5.1 10*3/uL (ref 1.7–7.7)
Neutrophils Relative %: 52 %
Platelets: 356 10*3/uL (ref 150–400)
RBC: 3.96 MIL/uL (ref 3.87–5.11)
RDW: 12.8 % (ref 11.5–15.5)
WBC: 9.7 10*3/uL (ref 4.0–10.5)
nRBC: 0 % (ref 0.0–0.2)

## 2021-05-19 LAB — GLUCOSE, CAPILLARY
Glucose-Capillary: 103 mg/dL — ABNORMAL HIGH (ref 70–99)
Glucose-Capillary: 101 mg/dL — ABNORMAL HIGH (ref 70–99)
Glucose-Capillary: 122 mg/dL — ABNORMAL HIGH (ref 70–99)
Glucose-Capillary: 163 mg/dL — ABNORMAL HIGH (ref 70–99)

## 2021-05-19 LAB — BASIC METABOLIC PANEL
Anion gap: 7 (ref 5–15)
BUN: 9 mg/dL (ref 8–23)
CO2: 22 mmol/L (ref 22–32)
Calcium: 9.2 mg/dL (ref 8.9–10.3)
Chloride: 115 mmol/L — ABNORMAL HIGH (ref 98–111)
Creatinine, Ser: 0.58 mg/dL (ref 0.44–1.00)
GFR, Estimated: >60 mL/min (ref >60)
Glucose, Bld: 90 mg/dL (ref 70–99)
Potassium: 5.5 mmol/L — ABNORMAL HIGH (ref 3.5–5.1)
Sodium: 144 mmol/L (ref 135–145)

## 2021-05-19 LAB — POTASSIUM: Potassium: 4.7 mmol/L (ref 3.5–5.1)

## 2021-05-19 MED ORDER — SODIUM ZIRCONIUM CYCLOSILICATE 5 G PO PACK
5.0000 g | PACK | Freq: Two times a day (BID) | ORAL | Status: DC
  Administered 2021-05-19: 5 g via ORAL
  Filled 2021-05-19: qty 1

## 2021-05-19 MED ORDER — DILTIAZEM HCL ER COATED BEADS 180 MG PO CP24
180.0000 mg | ORAL_CAPSULE | Freq: Every day | ORAL | Status: DC
  Administered 2021-05-19: 180 mg via ORAL
  Filled 2021-05-19: qty 1

## 2021-05-19 MED ORDER — HYDRALAZINE HCL 100 MG PO TABS: 100.0000 mg | ORAL_TABLET | Freq: Three times a day (TID) | ORAL | Status: DC

## 2021-05-19 MED ORDER — LOSARTAN POTASSIUM 50 MG PO TABS
50.0000 mg | ORAL_TABLET | Freq: Every day | ORAL | Status: DC
  Administered 2021-05-19: 50 mg via ORAL
  Filled 2021-05-19: qty 1

## 2021-05-19 MED ORDER — METHYLPREDNISOLONE 4 MG PO TBPK: ORAL_TABLET | ORAL | 0 refills | Status: DC

## 2021-05-19 MED ORDER — METHYLPREDNISOLONE SODIUM SUCC 1000 MG IJ SOLR
500.0000 mg | INTRAMUSCULAR | Status: DC
  Administered 2021-05-19: 500 mg via INTRAVENOUS
  Filled 2021-05-19 (×2): qty 4

## 2021-05-19 MED ORDER — HYDRALAZINE HCL 25 MG PO TABS
100.0000 mg | ORAL_TABLET | Freq: Three times a day (TID) | ORAL | Status: DC
  Administered 2021-05-19 (×2): 100 mg via ORAL
  Filled 2021-05-19 (×2): qty 4

## 2021-05-19 NOTE — Discharge Summary (Signed)
Physician Discharge Summary  Bridget Woods ZOX:[id number] DOB: 1955-09-19 DOA: 05/17/2021  PCP: Virl Diamond, MD  Admit date: 05/17/2021 Discharge date: 05/19/2021  Time spent: 55 minutes  Recommendations for Outpatient Follow-up:  Follow-up with primary neurologist, Dr.Owusu-Yaw in 2 weeks. Follow-up with Virl Diamond, MD in 1 to 2 weeks.  On follow-up patient will need a comprehensive metabolic profile done to follow-up on electrolytes and renal function and LFTs. Follow-up in outpatient physical therapy.   Discharge Diagnoses:  Principal Problem:   Migraine: Complex Active Problems:   Acute right-sided weakness   Postsurgical hypothyroidism   GERD (gastroesophageal reflux disease)   Depression   Hypertension   Hypokalemia   Thrombocytosis   Hyperbilirubinemia   Aortic atherosclerosis (HCC)   Discharge Condition: Stable and improved  Diet recommendation: Heart healthy  Filed Weights   05/17/21 1932  Weight: 69.9 kg    History of present illness:  HPI per Dr. Reinaldo Berber is a 65 y.o. female with medical history significant of anxiety, depression, rheumatoid arthritis, fibromyalgia, GERD, headaches, hypertension, hyperthyroidism with thyroidectomy and postsurgical hypothyroidism who is coming to the emergency department with complaints of right-sided weakness since she woke up around 6:30 in the morning.  LKW was Tuesday evening.  She has had a frontal headache with visual disturbance, felt that she was unable to keep her right eye open, has had some difficulty ambulating which have improved since then.  She also has not been eating much since Sunday and stated yesterday she was momentarily diaphoretic.  She denied fever, chills, sore throat, rhinorrhea, productive cough, wheezing, hemoptysis, dyspnea, chest pain, palpitations, PND, orthopnea or increasing pitting edema lower extremities.  No abdominal pain, nausea, emesis, diarrhea, constipation, melena  or hematochezia.  No dysuria, frequency or hematuria.  No polyuria, polydipsia, polyphagia or blurred vision.   ED Course: Initial vital signs were temperature 98.2 F, pulse 102, respiration 22, BP 188/96 mmHg O2 sat 99% on room air.  She received 10 mEq of KCl IVPB.  Lab work: Her CBC showed a white count of 9.9, hemoglobin 14.5 g/dL and platelets 811.  Potassium was 2.30 mmol/L the rest of the electrolytes were normal.  Total protein is 8.5 g/dL and total bilirubin was 2.4 mg/dL.   Imaging: Portable chest radiograph did not show any active cardiopulmonary disease.  CTA head did not show any acute intracranial pathology.  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  There was aortic atherosclerosis.  Hospital Course:  #1 acute right-sided weakness secondary to complex migraine -Patient admitted with acute right-sided weakness with headache, right neck pain. -Patient admitted due to concerns for acute CVA. -CT angiogram head and neck done negative for any acute abnormalities or large vessel occlusion. -2D echo done with normal EF, no source of emboli noted. -MRI brain done negative for any acute abnormalities. -Carotid Dopplers with no hemodynamically significant ICA stenosis. -Patient noted to be on aspirin prior to admission and initially Plavix was added however after acute CVA was ruled out Plavix discontinued.   -Patient seen in consultation by neurology who felt patient's symptoms were likely secondary to complex migraine.  CRP obtained was 1.0, sed rate obtained was 45.  -Neurology recommended 2-day course of high-dose IV Solu-Medrol however patient received 1 day of Solu-Medrol 500 mg x 1, patient had improved clinically and patient was anxious to be discharged.  Case discussed with neurology on day of discharge  and it was felt that as patient had improved clinically patient was okay from their standpoint for discharge  on a Medrol Dosepak.  -Outpatient follow-up with primary neurologist Dr. Maryln Gottron. -Patient was discharged in stable and improved condition.    2.  Hypertension/hypertensive urgency -Concern for possible acute CVA on admission as such patient's antihypertensive medications except the propranolol was held for permissive hypertension.   -Once MRI was negative for acute CVA patient was resumed back on her home antihypertensive medications with improvement with blood pressure control.   -Outpatient follow-up with PCP.   3.  Hypokalemia -Repleted during the hospitalization.   -Outpatient follow-up with PCP.    4.  Postsurgical hypothyroidism -Patient maintained on home regimen Synthroid.  5.  Depression Patient maintained on Cymbalta.  6.  GERD Patient maintained on Pepcid.  7.  Aortic atherosclerosis -Patient maintained on statin.  8.  Hyperbilirubinemia -Bilirubin levels trended down. -Outpatient follow-up with PCP    Procedures: CT angiogram head and neck 8/31/202 MRI head 05/18/2021 Carotid Dopplers  05/18/2021 2D echo  05/18/2021     Consultations: Neurology: Dr. Gerilyn Pilgrim 05/18/2021  Discharge Exam: Vitals:   05/19/21 0615 05/19/21 1540  BP: (!) 179/88 (!) 148/85  Pulse: 62   Resp: 16   Temp: 98.6 F (37 C)   SpO2: 100%     General: NAD Cardiovascular: RRR no murmurs rubs or gallops.  No JVD.  No lower extremity edema. Respiratory: CTA B.  No wheezes, no crackles, no rhonchi.  Discharge Instructions   Discharge Instructions     Ambulatory referral to Physical Therapy   Complete by: As directed    Diet - low sodium heart healthy   Complete by: As directed    Increase activity slowly   Complete by: As directed       Allergies as of 05/19/2021       Reactions   Naproxen Other (See Comments)   Contact Dermatitis--oral blisters   Ciprofibrate Nausea Only        Medication List     STOP taking these medications    alendronate 70 MG  tablet Commonly known as: FOSAMAX   amitriptyline 10 MG tablet Commonly known as: ELAVIL   Belsomra 10 MG Tabs Generic drug: Suvorexant       TAKE these medications    ALPRAZolam 0.5 MG tablet Commonly known as: XANAX Take 0.5 mg by mouth 3 (three) times daily as needed for anxiety.   aspirin EC 81 MG tablet Take 81 mg by mouth at bedtime.   aspirin-acetaminophen-caffeine 250-250-65 MG tablet Commonly known as: EXCEDRIN MIGRAINE Take 2 tablets by mouth 2 (two) times daily as needed for headache or migraine.   atorvastatin 10 MG tablet Commonly known as: LIPITOR Take 10 mg by mouth daily.   calcium-vitamin D 500-200 MG-UNIT tablet Commonly known as: OSCAL WITH D Take 1 tablet by mouth 2 (two) times daily.   diltiazem 180 MG 24 hr capsule Commonly known as: CARDIZEM CD Take 180 mg by mouth daily.   DULoxetine 60 MG capsule Commonly known as: CYMBALTA Take 60 mg by mouth 2 (two) times daily.   Enbrel Mini 50 MG/ML Soct Generic drug: Etanercept Inject into the skin.   folic acid 1 MG tablet Commonly known as: FOLVITE Take 1 mg by mouth daily.   hydrALAZINE 100 MG tablet Commonly known as: APRESOLINE Take 100 mg by mouth 3 (three) times daily.   HYDROcodone-acetaminophen 5-325 MG tablet Commonly known as: Norco Take 1 tablet by mouth  every 4 (four) hours as needed for moderate pain.   levothyroxine 75 MCG tablet Commonly known as: SYNTHROID TAKE 1 TABLET BY MOUTH DAILY BEFORE BREAKFAST What changed:  how much to take how to take this when to take this additional instructions   losartan 50 MG tablet Commonly known as: COZAAR Take 50 mg by mouth daily.   meloxicam 15 MG tablet Commonly known as: MOBIC Take 15 mg by mouth daily.   methylPREDNISolone 4 MG Tbpk tablet Commonly known as: MEDROL DOSEPAK Take as directed   potassium chloride SA 20 MEQ tablet Commonly known as: KLOR-CON Take 20 mEq by mouth 2 (two) times daily.   predniSONE 5 MG  tablet Commonly known as: DELTASONE Take 5 mg by mouth daily as needed. For rheumatoid arthritis flare ups   propranolol 40 MG tablet Commonly known as: INDERAL Take 60 mg by mouth 2 (two) times daily.   QUEtiapine 25 MG tablet Commonly known as: SEROQUEL Take 1 tablet by mouth at bedtime.   Qulipta 60 MG Tabs Generic drug: Atogepant Take 1 tablet by mouth at bedtime.   ranitidine 150 MG tablet Commonly known as: ZANTAC Take 150 mg by mouth 2 (two) times daily.   tiZANidine 4 MG tablet Commonly known as: ZANAFLEX Take 4 mg by mouth See admin instructions. Take 1 tablet (4 mg) in the morning & 2 tablets (8 mg) at bedtime.   Ubrelvy 100 MG Tabs Generic drug: Ubrogepant Take 1 tablet by mouth daily as needed (headache).       Allergies  Allergen Reactions   Naproxen Other (See Comments)    Contact Dermatitis--oral blisters   Ciprofibrate Nausea Only    Follow-up Information     [facility] Follow up.   Specialty: Rehabilitation Contact information: [address] [id number] [place] [address] [phone]        Freddi Starr, MD. Schedule an appointment as soon as possible for a visit in 2 week(s).   Specialty: Psychiatry Contact information: [address] [place] [address] [phone]         Virl Diamond, MD. Schedule an appointment as soon as possible for a visit in 2 week(s).   Specialty: Family Medicine Why: Follow-up in 1 to 2 weeks. Contact information: [address] [place] [address] [phone]                  The results of significant diagnostics from this hospitalization (including imaging, microbiology, ancillary and laboratory) are listed below for reference.    Significant Diagnostic Studies: CT ANGIO HEAD NECK W WO CM  Result Date: 05/17/2021 CLINICAL DATA:  Right-sided numbness EXAM: CT ANGIOGRAPHY HEAD AND NECK TECHNIQUE:  Multidetector CT imaging of the head and neck was performed using the standard protocol during bolus administration of intravenous contrast. Multiplanar CT image reconstructions and MIPs were obtained to evaluate the vascular anatomy. Carotid stenosis measurements (when applicable) are obtained utilizing NASCET criteria, using the distal internal carotid diameter as the denominator. CONTRAST:  61mL OMNIPAQUE IOHEXOL 350 MG/ML SOLN COMPARISON:  None. FINDINGS: CT HEAD FINDINGS Brain: There is no mass, hemorrhage or extra-axial collection. The size and configuration of the ventricles and extra-axial CSF spaces are normal. There is no acute or chronic infarction. The brain parenchyma is normal. Skull: The visualized skull base, calvarium and extracranial soft tissues are normal. Sinuses/Orbits: No fluid levels or advanced mucosal thickening of the visualized paranasal sinuses. No mastoid or middle ear  effusion. The orbits are normal. CTA NECK FINDINGS SKELETON: There is no bony spinal canal stenosis. No lytic or blastic lesion. OTHER NECK: Normal pharynx, larynx and major salivary glands. No cervical lymphadenopathy. Right hemi thyroidectomy. UPPER CHEST: No pneumothorax or pleural effusion. No nodules or masses. AORTIC ARCH: There is calcific atherosclerosis of the aortic arch. There is no aneurysm, dissection or hemodynamically significant stenosis of the visualized portion of the aorta. Conventional 3 vessel aortic branching pattern. The visualized proximal subclavian arteries are widely patent. RIGHT CAROTID SYSTEM: Normal without aneurysm, dissection or stenosis. LEFT CAROTID SYSTEM: Normal without aneurysm, dissection or stenosis. VERTEBRAL ARTERIES: Left dominant configuration. Both origins are clearly patent. There is no dissection, occlusion or flow-limiting stenosis to the skull base (V1-V3 segments). CTA HEAD FINDINGS POSTERIOR CIRCULATION: --Vertebral arteries: Normal V4 segments. --Inferior cerebellar  arteries: Normal. --Basilar artery: Normal. --Superior cerebellar arteries: Normal. --Posterior cerebral arteries (PCA): Normal. 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 --Anterior cerebral arteries (ACA): Normal. Both A1 segments are present. Patent anterior communicating artery (a-comm). --Middle cerebral arteries (MCA): Normal. VENOUS SINUSES: As permitted by contrast timing, patent. ANATOMIC VARIANTS: None Review of the MIP images confirms the above findings. IMPRESSION: 1. No intracranial arterial occlusion or high-grade stenosis. 2. 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 Aortic Atherosclerosis (ICD10-I70.0). Electronically Signed   By: Deatra Robinson M.D.   On: 05/17/2021 22:43   MR BRAIN W WO CONTRAST  Result Date: 05/18/2021 CLINICAL DATA:  Neuro deficit, acute, stroke suspected; right-sided weakness EXAM: MRI HEAD WITHOUT AND WITH CONTRAST TECHNIQUE: Multiplanar, multiecho pulse sequences of the brain and surrounding structures were obtained without and with intravenous contrast. CONTRAST:  62mL GADAVIST GADOBUTROL 1 MMOL/ML IV SOLN COMPARISON:  None. FINDINGS: Brain: There is no acute infarction or intracranial hemorrhage. There is no intracranial mass, mass effect, or edema. There is no hydrocephalus or extra-axial fluid collection. Ventricles and sulci are normal in size and configuration. Minimal small foci of T2 hyperintensity in the supratentorial white matter are nonspecific but may reflect minor chronic microvascular ischemic changes. Incidental note is made of a small left frontal developmental venous anomaly. Vascular: Major vessel flow voids at the skull base are preserved. Skull and upper cervical spine: Normal marrow signal is preserved. Sinuses/Orbits: Paranasal sinuses are aerated. Orbits are  unremarkable. Other: Sella is partially empty.  Mastoid air cells are clear. IMPRESSION: No evidence of recent infarction, hemorrhage, or mass. Minor chronic microvascular ischemic changes. Electronically Signed   By: Guadlupe Spanish M.D.   On: 05/18/2021 09:17   US Carotid Bilateral  Result Date: 05/18/2021 CLINICAL DATA:  Intracranial aneurysm diagnosed on recent CT angiography of the head EXAM: BILATERAL CAROTID DUPLEX ULTRASOUND TECHNIQUE: Wallace Cullens scale imaging, color Doppler and duplex ultrasound were performed of bilateral carotid and vertebral arteries in the neck. COMPARISON:  None. FINDINGS: Criteria: Quantification of carotid stenosis is based on velocity parameters that correlate the residual internal carotid diameter with NASCET-based stenosis levels, using the diameter of the distal internal carotid lumen as the denominator for stenosis measurement. The following velocity measurements were obtained: RIGHT ICA: 87/28 cm/sec CCA: 66/16 cm/sec SYSTOLIC ICA/CCA RATIO:  1.3 ECA: 69 cm/sec LEFT ICA: 76/25 cm/sec CCA: 80/18 cm/sec SYSTOLIC ICA/CCA RATIO:  0.8 ECA: 104 cm/sec RIGHT CAROTID ARTERY: Mild atherosclerotic plaque in the bulb results in no significant stenosis. Normal  low resistance waveform in internal carotid artery. RIGHT VERTEBRAL ARTERY:  Antegrade flow LEFT CAROTID ARTERY: No significant atherosclerotic plaque. Normal low resistance waveform in the internal carotid artery. LEFT VERTEBRAL ARTERY:  Antegrade flow IMPRESSION: No evidence of hemodynamically significant stenosis involving either the right or left carotid circulation in the neck by Doppler criteria. The visualized portions of carotid arteries in the neck are normal in caliber without significant stenosis. Electronically Signed   By: Olive Bass M.D.   On: 05/18/2021 10:45   DG Chest Portable 1 View  Result Date: 05/17/2021 CLINICAL DATA:  Chest pain.  Right-sided numbness. EXAM: PORTABLE CHEST 1 VIEW COMPARISON:  CT abdomen  pelvis 06/03/2020 FINDINGS: The heart and mediastinal contours are within normal limits. Persistent right base linear atelectasis versus scarring. no focal consolidation. No pulmonary edema. Nonspecific blunting of the right costophrenic angle with no definite pleural effusion. No pneumothorax. No acute osseous abnormality. IMPRESSION: No active disease. Electronically Signed   By: Tish Frederickson M.D.   On: 05/17/2021 20:46   ECHOCARDIOGRAM COMPLETE  Result Date: 05/18/2021    ECHOCARDIOGRAM REPORT   Patient Name:   SHARLIZE HOAR Date of Exam: 05/18/2021 Medical Rec #:  [id number]         Height:       62.0 in Accession #:    [id number]        Weight:       154.0 lb Date of Birth:  Mar 27, 1956          BSA:          1.711 m Patient Age:    65 years          BP:           183/87 mmHg Patient Gender: F                 HR:           87 bpm. [facility] Procedure: 2D Echo, Cardiac Doppler and Color Doppler Indications:    CVA  History:        Patient has no prior history of Echocardiogram examinations.                 Signs/Symptoms:Weakness; Risk Factors:Hypertension.  Sonographer:    Lavenia Atlas RDCS Referring Phys: [id number] DAVID MANUEL ORTIZ  Sonographer Comments: Suboptimal subcostal window. IMPRESSIONS  1. Left ventricular ejection fraction, by estimation, is 65 to 70%. The left ventricle has normal function. The left ventricle has no regional wall motion abnormalities. There is mild left ventricular hypertrophy. Left ventricular diastolic parameters were normal.  2. Right ventricular systolic function is normal. The right ventricular size is normal. Tricuspid regurgitation signal is inadequate for assessing PA pressure.  3. The mitral valve is grossly normal. Trivial mitral valve regurgitation.  4. The aortic valve is tricuspid. Aortic valve regurgitation is not visualized.  5. The inferior vena cava is normal in size with greater than 50% respiratory variability, suggesting right atrial  pressure of 3 mmHg. Comparison(s): No prior Echocardiogram. FINDINGS  Left Ventricle: Left ventricular ejection fraction, by estimation, is 65 to 70%. The left ventricle has normal function. The left ventricle has no regional wall motion abnormalities. The left ventricular internal cavity size was normal in size. There is  mild left ventricular hypertrophy. Left ventricular diastolic parameters were normal. Right Ventricle: The right ventricular size is normal. No increase in right ventricular wall thickness. Right ventricular systolic function is normal. Tricuspid regurgitation signal is  inadequate for assessing PA pressure. Left Atrium: Left atrial size was normal in size. Right Atrium: Right atrial size was normal in size. Pericardium: There is no evidence of pericardial effusion. Mitral Valve: The mitral valve is grossly normal. Trivial mitral valve regurgitation. Tricuspid Valve: The tricuspid valve is grossly normal. Tricuspid valve regurgitation is trivial. Aortic Valve: The aortic valve is tricuspid. Aortic valve regurgitation is not visualized. Pulmonic Valve: The pulmonic valve was grossly normal. Pulmonic valve regurgitation is trivial. Aorta: The aortic root is normal in size and structure. Venous: The inferior vena cava is normal in size with greater than 50% respiratory variability, suggesting right atrial pressure of 3 mmHg. IAS/Shunts: No atrial level shunt detected by color flow Doppler.  LEFT VENTRICLE PLAX 2D LVIDd:         4.49 cm  Diastology LVIDs:         2.70 cm  LV e' medial:    23.44 cm/s LV PW:         1.27 cm  LV E/e' medial:  3.0 LV IVS:        1.10 cm  LV e' lateral:   13.30 cm/s LVOT diam:     2.00 cm  LV E/e' lateral: 5.3 LV SV:         70 LV SV Index:   41 LVOT Area:     3.14 cm  RIGHT VENTRICLE RV Basal diam:  2.84 cm RV S prime:     11.90 cm/s TAPSE (M-mode): 1.2 cm LEFT ATRIUM             Index       RIGHT ATRIUM          Index LA diam:        3.20 cm 1.87 cm/m  RA Area:     7.47  cm LA Vol (A2C):   34.0 ml 19.87 ml/m RA Volume:   13.40 ml 7.83 ml/m LA Vol (A4C):   50.1 ml 29.29 ml/m LA Biplane Vol: 41.3 ml 24.14 ml/m  AORTIC VALVE LVOT Vmax:   102.00 cm/s LVOT Vmean:  72.200 cm/s LVOT VTI:    0.223 m  AORTA Ao Root diam: 2.80 cm MITRAL VALVE MV Area (PHT): 4.01 cm    SHUNTS MV Decel Time: 189 msec    Systemic VTI:  0.22 m MV E velocity: 70.30 cm/s  Systemic Diam: 2.00 cm MV A velocity: 82.70 cm/s MV E/A ratio:  0.85 Nona DellSamuel Mcdowell MD Electronically signed by Nona DellSamuel Mcdowell MD Signature Date/Time: 05/18/2021/11:54:04 AM    Final     Microbiology: Recent Results (from the past 240 hour(s))  Resp Panel by RT-PCR (Flu A&B, Covid) Nasopharyngeal Swab     Status: None   Collection Time: 05/17/21 10:00 PM   Specimen: Nasopharyngeal Swab; Nasopharyngeal(NP) swabs in vial transport medium  Result Value Ref Range Status   SARS Coronavirus 2 by RT PCR NEGATIVE NEGATIVE Final    Comment: (NOTE) SARS-CoV-2 target nucleic acids are NOT DETECTED.  The SARS-CoV-2 RNA is generally detectable in upper respiratory specimens during the acute phase of infection. The lowest concentration of SARS-CoV-2 viral copies this assay can detect is 138 copies/mL. A negative result does not preclude SARS-Cov-2 infection and should not be used as the sole basis for treatment or other patient management decisions. A negative result may occur with  improper specimen collection/handling, submission of specimen other than nasopharyngeal swab, presence of viral mutation(s) within the areas targeted by this assay, and inadequate number of viral  copies(<138 copies/mL). A negative result must be combined with clinical observations, patient history, and epidemiological information. The expected result is Negative.  Fact Sheet for Patients:  BloggerCourse.com  Fact Sheet for Healthcare Providers:  SeriousBroker.it  This test is no t yet approved or  cleared by the [place] FDA and  has been authorized for detection and/or diagnosis of SARS-CoV-2 by FDA under an Emergency Use Authorization (EUA). This EUA will remain  in effect (meaning this test can be used) for the duration of the COVID-19 declaration under Section 564(b)(1) of the Act, 21 U.S.C.section 360bbb-3(b)(1), unless the authorization is terminated  or revoked sooner.       Influenza A by PCR NEGATIVE NEGATIVE Final   Influenza B by PCR NEGATIVE NEGATIVE Final    Comment: (NOTE) The Xpert Xpress SARS-CoV-2/FLU/RSV plus assay is intended as an aid in the diagnosis of influenza from Nasopharyngeal swab specimens and should not be used as a sole basis for treatment. Nasal washings and aspirates are unacceptable for Xpert Xpress SARS-CoV-2/FLU/RSV testing.  Fact Sheet for Patients: BloggerCourse.com  Fact Sheet for Healthcare Providers: SeriousBroker.it  This test is not yet approved or cleared by the [place] FDA and has been authorized for detection and/or diagnosis of SARS-CoV-2 by FDA under an Emergency Use Authorization (EUA). This EUA will remain in effect (meaning this test can be used) for the duration of the COVID-19 declaration under Section 564(b)(1) of the Act, 21 U.S.C. section 360bbb-3(b)(1), unless the authorization is terminated or revoked.  Performed at [facility], [address], [place], [place] [address]      Labs: Basic Metabolic Panel: Recent Labs  Lab 05/17/21 1941 05/18/21 0423 05/18/21 0951 05/19/21 0530 05/19/21 1336  NA 135 138 136 144  --   K 2.3* 3.4* 2.7* 5.5* 4.7  CL 99 106 106 115*  --   CO2 28 26 21* 22  --   GLUCOSE 97 92 98 90  --   BUN 11 8 6* 9  --   CREATININE 0.74 0.65 0.49 0.58  --   CALCIUM 9.1 8.1* 8.3* 9.2  --   MG 2.0  --  2.4  --   --    Liver Function Tests: Recent Labs  Lab 05/17/21 1941 05/18/21 0423  AST 20 13*  ALT 17 14  ALKPHOS 75 64   BILITOT 2.4* 2.1*  PROT 8.5* 7.5  ALBUMIN 4.2 3.6   No results for input(s): LIPASE, AMYLASE in the last 168 hours. No results for input(s): AMMONIA in the last 168 hours. CBC: Recent Labs  Lab 05/17/21 1941 05/18/21 0951 05/19/21 0530  WBC 9.9 8.0 9.7  NEUTROABS 5.4 5.0 5.1  HGB 14.5 13.5 12.5  HCT 43.0 41.3 39.9  MCV 94.9 96.3 100.8*  PLT 459* 378 356   Cardiac Enzymes: No results for input(s): CKTOTAL, CKMB, CKMBINDEX, TROPONINI in the last 168 hours. BNP: BNP (last 3 results) No results for input(s): BNP in the last 8760 hours.  ProBNP (last 3 results) No results for input(s): PROBNP in the last 8760 hours.  CBG: Recent Labs  Lab 05/18/21 1246 05/19/21 0126 05/19/21 0603 05/19/21 1114 05/19/21 1612  GLUCAP 96 103* 101* 122* 163*       Signed:  Ramiro Harvest MD.  [facility] 05/19/2021, 7:25 PM

## 2021-05-19 NOTE — Progress Notes (Signed)
Patient to be discharged home. Granddaughter, Diamond Jentz to pick patient up. Patient belongings sent home with patient. Cardiac monitor and IV removed. Discharge paperwork reviewed and sent home with patient. Patient denies having any questions. Patient escorted to front entrance for discharge.

## 2021-05-19 NOTE — Progress Notes (Signed)
SLP Cancellation Note  Patient Details Name: Bridget Woods MRN: [id number] DOB: 27-Jan-1956   Cancelled treatment:       Reason Eval/Treat Not Completed: SLP screened, no needs identified, will sign off. Symptoms have resolved; Cognitive-linguistic function is at baseline. Thank you,  Santi Troung H. Romie Levee, CCC-SLP Speech Language Pathologist   Georgetta Haber 05/19/2021, 8:18 AM

## 2021-05-19 NOTE — Progress Notes (Signed)
Initial Nutrition Assessment  DOCUMENTATION CODES:      INTERVENTION:  Ensure Enlive po BID, each supplement provides 350 kcal and 20 grams of protein    NUTRITION DIAGNOSIS:   Inadequate oral intake related to acute illness as evidenced by per patient/family report.   GOAL:  Patient will meet greater than or equal to 90% of their needs   MONITOR:  PO intake, Supplement acceptance, Labs, Weight trends  REASON FOR ASSESSMENT:   Malnutrition Screening Tool    ASSESSMENT: Patient is a 65 yo female with hx of RA, GERD, headaches, HTN and depression. Presents with acute right side weakness. Neurology consulted and brain MRI obtained.   Patient is sitting up in bed on her iPAD. Husband is bedside. She reports to be eating well. She doesn't eat broccoli or brussels sprouts. Nutrition services obtaining meal selections. Patient says she may be discharged later today.   Patient weight is down ~ 4 lb with acute illness.  She is drinking the Ensure supplements which will help prevent additional weight loss.   Medications reviewed.   Labs: BMP Latest Ref Rng & Units 05/19/2021 05/19/2021 05/18/2021  Glucose 70 - 99 mg/dL - 90 98  BUN 8 - 23 mg/dL - 9 6(L)  Creatinine 3.81 - 1.00 mg/dL - 8.29 9.37  BUN/Creat Ratio 6 - 22 (calc) - - -  Sodium 135 - 145 mmol/L - 144 136  Potassium 3.5 - 5.1 mmol/L 4.7 5.5(H) 2.7(LL)  Chloride 98 - 111 mmol/L - 115(H) 106  CO2 22 - 32 mmol/L - 22 21(L)  Calcium 8.9 - 10.3 mg/dL - 9.2 1.6(R)       NUTRITION - FOCUSED PHYSICAL EXAM: Nutrition-Focused physical exam completed. Findings are no fat depletion, no muscle depletion, and no edema.     Diet Order:   Diet Order             Diet Heart Room service appropriate? Yes; Fluid consistency: Thin  Diet effective now                   EDUCATION NEEDS:  Education needs have been addressed  Skin:  Skin Assessment: Reviewed RN Assessment  Last BM:  9/1  Height:   Ht Readings from Last 1  Encounters:  05/17/21 5\' 2"  (1.575 m)    Weight:   Wt Readings from Last 1 Encounters:  05/17/21 69.9 kg    Ideal Body Weight:   50 kg  BMI:  Body mass index is 28.17 kg/m.  Estimated Nutritional Needs:   Kcal:  05/19/21  Protein:  84-91 gr  Fluid:  1.7-1.8 liters daily  [id number] MS,RD,CSG,LDN Contact: Royann Shivers

## 2021-05-19 NOTE — Care Management Obs Status (Signed)
MEDICARE OBSERVATION STATUS NOTIFICATION   Patient Details  Name: Bridget Woods MRN: [id number] Date of Birth: 25-Nov-1955   Medicare Observation Status Notification Given:  Yes    Corey Harold 05/19/2021, 11:20 AM

## 2021-07-10 ENCOUNTER — Ambulatory Visit: Payer: Medicare HMO | Admitting: Nurse Practitioner

## 2021-07-26 NOTE — Patient Instructions (Signed)

## 2021-07-27 ENCOUNTER — Encounter: Payer: Self-pay | Admitting: Nurse Practitioner

## 2021-07-27 ENCOUNTER — Other Ambulatory Visit: Payer: Self-pay

## 2021-07-27 ENCOUNTER — Ambulatory Visit: Payer: Medicare HMO | Admitting: Nurse Practitioner

## 2021-07-27 VITALS — BP 127/78 | HR 70 | Ht 62.0 in | Wt 166.0 lb

## 2021-07-27 DIAGNOSIS — E890 Postprocedural hypothyroidism: Secondary | ICD-10-CM | POA: Diagnosis not present

## 2021-07-27 DIAGNOSIS — E6609 Other obesity due to excess calories: Secondary | ICD-10-CM

## 2021-07-27 DIAGNOSIS — Z6830 Body mass index (BMI) 30.0-30.9, adult: Secondary | ICD-10-CM | POA: Diagnosis not present

## 2021-07-27 NOTE — Progress Notes (Signed)
07/27/2021, 9:29 AM      Endocrinology follow-up note                      Subjective:    Patient ID: Bridget Woods, female    DOB: 1956/04/13, PCP Bridget Common Jarvis Newcomer, MD   Past Medical History:  Diagnosis Date   Anxiety    Arthritis    RA   Depression    Fibromyalgia    GERD (gastroesophageal reflux disease)    Headache    Hypertension    Hyperthyroidism    Past Surgical History:  Procedure Laterality Date   ABDOMINAL HYSTERECTOMY     BREAST SURGERY Left    removal of benign tumor   HERNIA REPAIR     Umbilical   THYROIDECTOMY N/A 04/14/2018   Procedure: TOTAL THYROIDECTOMY;  Surgeon: Bridget Signs, MD;  Location: [facility];  Service: General;  Laterality: N/A;   Social History   Socioeconomic History   Marital status: Married    Spouse name: Not on file   Number of children: Not on file   Years of education: Not on file   Highest education level: Not on file  Occupational History   Not on file  Tobacco Use   Smoking status: Never   Smokeless tobacco: Never  Vaping Use   Vaping Use: Never used  Substance and Sexual Activity   Alcohol use: Not Currently   Drug use: Never   Sexual activity: Yes    Birth control/protection: Surgical  Other Topics Concern   Not on file  Social History Narrative   Not on file   Social Determinants of Health   Financial Resource Strain: Not on file  Food Insecurity: Not on file  Transportation Needs: Not on file  Physical Activity: Not on file  Stress: Not on file  Social Connections: Not on file   Outpatient Encounter Medications as of 07/27/2021  Medication Sig   ALPRAZolam (XANAX) 0.5 MG tablet Take 0.5 mg by mouth 3 (three) times daily as needed for anxiety.   aspirin EC 81 MG tablet Take 81 mg by mouth at bedtime.    aspirin-acetaminophen-caffeine (EXCEDRIN MIGRAINE) 250-250-65 MG tablet Take 2 tablets by mouth 2 (two) times daily as needed for headache or  migraine.   atorvastatin (LIPITOR) 10 MG tablet Take 10 mg by mouth daily.   calcium-vitamin D (OSCAL WITH D) 500-200 MG-UNIT tablet Take 1 tablet by mouth 2 (two) times daily.   diltiazem (CARDIZEM CD) 180 MG 24 hr capsule Take 180 mg by mouth daily.   DULoxetine (CYMBALTA) 60 MG capsule Take 60 mg by mouth 2 (two) times daily.   ENBREL MINI 50 MG/ML SOCT Inject into the skin.   folic acid (FOLVITE) 1 MG tablet Take 1 mg by mouth daily.   hydrALAZINE (APRESOLINE) 10 MG tablet Take 10 mg by mouth 3 (three) times daily.   HYDROcodone-acetaminophen (NORCO) 10-325 MG tablet Take 1 tablet by mouth 2 (two) times daily as needed.   hydrOXYzine (ATARAX/VISTARIL) 10 MG tablet Take 10 mg by mouth 2 (two) times daily.   levothyroxine (SYNTHROID) 75 MCG tablet TAKE 1 TABLET BY MOUTH DAILY BEFORE BREAKFAST (Patient taking differently: Take 75 mcg  by mouth daily before breakfast.)   losartan (COZAAR) 50 MG tablet Take 50 mg by mouth daily.   meloxicam (MOBIC) 15 MG tablet Take 15 mg by mouth daily.   pantoprazole (PROTONIX) 40 MG tablet Take 40 mg by mouth daily.   potassium chloride SA (K-DUR,KLOR-CON) 20 MEQ tablet Take 20 mEq by mouth 2 (two) times daily.   predniSONE (DELTASONE) 5 MG tablet Take 5 mg by mouth daily as needed. For rheumatoid arthritis flare ups   propranolol (INDERAL) 40 MG tablet Take 60 mg by mouth 2 (two) times daily.   QUEtiapine (SEROQUEL) 25 MG tablet Take 1 tablet by mouth at bedtime.   QULIPTA 60 MG TABS Take 1 tablet by mouth at bedtime.   tiZANidine (ZANAFLEX) 4 MG tablet Take 4 mg by mouth See admin instructions. Take 1 tablet (4 mg) in the morning & 2 tablets (8 mg) at bedtime.   TRULANCE 3 MG TABS Take 1 tablet by mouth daily.   UBRELVY 100 MG TABS Take 1 tablet by mouth daily as needed (headache).   hydrALAZINE (APRESOLINE) 100 MG tablet Take 100 mg by mouth 3 (three) times daily. (Patient not taking: Reported on 07/27/2021)   HYDROcodone-acetaminophen (NORCO) 5-325 MG  tablet Take 1 tablet by mouth every 4 (four) hours as needed for moderate pain. (Patient not taking: Reported on 07/27/2021)   methylPREDNISolone (MEDROL DOSEPAK) 4 MG TBPK tablet Take as directed (Patient not taking: Reported on 07/27/2021)   ranitidine (ZANTAC) 150 MG tablet Take 150 mg by mouth 2 (two) times daily. (Patient not taking: No sig reported)   No facility-administered encounter medications on file as of 07/27/2021.   ALLERGIES: Allergies  Allergen Reactions   Naproxen Other (See Comments)    Contact Dermatitis--oral blisters   Cephalexin     YEAST INFECTION   Ciprofibrate Nausea Only    VACCINATION STATUS:  There is no immunization history on file for this patient.  HPI Bridget Woods is 65 y.o. 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  She is returning for follow-up for postsurgical hypothyroidism.  She is currently on levothyroxine 75 mcg p.o. daily before breakfast.    She is concerned about her recent weight gain.   She is status post total thyroidectomy on April 14, 2018 with benign outcomes.   She reports compliance.  Her previsit thyroid function tests are consistent with appropriate replacement.  Her previous symptoms of dysphagia, shortness of breath of resolved.  She denies palpitations, tremors, nor heat intolerance.  She reports better energy level.    Review of systems  Constitutional: + steadily increasing body weight,  current Body mass index is 30.36 kg/m. , no fatigue, no subjective hyperthermia, no subjective hypothermia Eyes: no blurry vision, no xerophthalmia ENT: no sore throat, no nodules palpated in throat, no dysphagia/odynophagia, no hoarseness Cardiovascular: no chest pain, no shortness of breath, no palpitations, no leg swelling Respiratory: no cough, no shortness of breath Gastrointestinal: no nausea/vomiting/diarrhea Musculoskeletal: + diffuse arthralgias- back pain- hx of fibromyalgia-  sees pain specialist Skin: no rashes, no hyperemia Neurological: + tremors since her hospitalization in August, no numbness, no tingling, no dizziness Psychiatric: no depression, no anxiety   Objective:    BP 127/78   Pulse 70   Ht 5\' 2"  (1.575 m)   Wt 166 lb (75.3 kg)   BMI 30.36 kg/m   Wt Readings from Last 3 Encounters:  07/27/21 166 lb (75.3 kg)  05/17/21 154 lb (69.9  kg)  01/06/21 164 lb 9.6 oz (74.7 kg)    BP Readings from Last 3 Encounters:  07/27/21 127/78  05/19/21 (!) 156/88  01/06/21 126/78     Physical Exam- Limited  Constitutional:  Body mass index is 30.36 kg/m. , not in acute distress, normal state of mind Eyes:  EOMI, no exophthalmos Neck: Supple Cardiovascular: RRR, no murmurs, rubs, or gallops, no edema Respiratory: Adequate breathing efforts, no crackles, rales, rhonchi, or wheezing Musculoskeletal: no gross deformities, strength intact in all four extremities, no gross restriction of joint movements Skin:  no rashes, no hyperemia Neurological: ++ tremor with outstretched hands bilaterally   Recent Results (from the past 2160 hour(s))  Comprehensive metabolic panel     Status: Abnormal   Collection Time: 05/17/21  7:41 PM  Result Value Ref Range   Sodium 135 135 - 145 mmol/L   Potassium 2.3 (LL) 3.5 - 5.1 mmol/L    Comment: CRITICAL RESULT CALLED TO, READ BACK BY AND VERIFIED WITH: WALKER,T ON 05/17/21 AT 2200 BY LOY,C    Chloride 99 98 - 111 mmol/L   CO2 28 22 - 32 mmol/L   Glucose, Bld 97 70 - 99 mg/dL    Comment: Glucose reference range applies only to samples taken after fasting for at least 8 hours.   BUN 11 8 - 23 mg/dL   Creatinine, Ser 0.74 0.44 - 1.00 mg/dL   Calcium 9.1 8.9 - 10.3 mg/dL   Total Protein 8.5 (H) 6.5 - 8.1 g/dL   Albumin 4.2 3.5 - 5.0 g/dL   AST 20 15 - 41 U/L   ALT 17 0 - 44 U/L   Alkaline Phosphatase 75 38 - 126 U/L   Total Bilirubin 2.4 (H) 0.3 - 1.2 mg/dL   GFR, Estimated >60 >60 mL/min    Comment:  (NOTE) Calculated using the CKD-EPI Creatinine Equation (2021)    Anion gap 8 5 - 15    Comment: Performed at [facility], [address] [place], [place] [address]  CBC with Differential     Status: Abnormal   Collection Time: 05/17/21  7:41 PM  Result Value Ref Range   WBC 9.9 4.0 - 10.5 K/uL   RBC 4.53 3.87 - 5.11 MIL/uL   Hemoglobin 14.5 12.0 - 15.0 g/dL   HCT 43.0 36.0 - 46.0 %   MCV 94.9 80.0 - 100.0 fL   MCH 32.0 26.0 - 34.0 pg   MCHC 33.7 30.0 - 36.0 g/dL   RDW 12.7 11.5 - 15.5 %   Platelets 459 (H) 150 - 400 K/uL   nRBC 0.0 0.0 - 0.2 %   Neutrophils Relative % 54 %   Neutro Abs 5.4 1.7 - 7.7 K/uL   Lymphocytes Relative 37 %   Lymphs Abs 3.7 0.7 - 4.0 K/uL   Monocytes Relative 8 %   Monocytes Absolute 0.7 0.1 - 1.0 K/uL   Eosinophils Relative 0 %   Eosinophils Absolute 0.0 0.0 - 0.5 K/uL   Basophils Relative 1 %   Basophils Absolute 0.1 0.0 - 0.1 K/uL   Immature Granulocytes 0 %   Abs Immature Granulocytes 0.03 0.00 - 0.07 K/uL    Comment: Performed at [facility], [address], [place], [place] [address]  Protime-INR     Status: None   Collection Time: 05/17/21  7:41 PM  Result Value Ref Range   Prothrombin Time 12.5 11.4 - 15.2 seconds   INR 0.9 0.8 - 1.2    Comment: (NOTE) INR goal  varies based on device and disease states. Performed at [facility], [address], [place], [place] [address]   Magnesium     Status: None   Collection Time: 05/17/21  7:41 PM  Result Value Ref Range   Magnesium 2.0 1.7 - 2.4 mg/dL    Comment: Performed at [facility], [address] [place], [place] [address]  Resp Panel by RT-PCR (Flu A&B, Covid) Nasopharyngeal Swab     Status: None   Collection Time: 05/17/21 10:00 PM   Specimen: Nasopharyngeal Swab; Nasopharyngeal(NP) swabs in vial transport medium  Result Value Ref Range   SARS Coronavirus 2 by RT PCR NEGATIVE NEGATIVE    Comment: (NOTE) SARS-CoV-2 target nucleic acids are NOT DETECTED.  The SARS-CoV-2 RNA is  generally detectable in upper respiratory specimens during the acute phase of infection. The lowest concentration of SARS-CoV-2 viral copies this assay can detect is 138 copies/mL. A negative result does not preclude SARS-Cov-2 infection and should not be used as the sole basis for treatment or other patient management decisions. A negative result may occur with  improper specimen collection/handling, submission of specimen other than nasopharyngeal swab, presence of viral mutation(s) within the areas targeted by this assay, and inadequate number of viral copies(<138 copies/mL). A negative result must be combined with clinical observations, patient history, and epidemiological information. The expected result is Negative.  Fact Sheet for Patients:  BloggerCourse.com  Fact Sheet for Healthcare Providers:  SeriousBroker.it  This test is no t yet approved or cleared by the [place] FDA and  has been authorized for detection and/or diagnosis of SARS-CoV-2 by FDA under an Emergency Use Authorization (EUA). This EUA will remain  in effect (meaning this test can be used) for the duration of the COVID-19 declaration under Section 564(b)(1) of the Act, 21 U.S.C.section 360bbb-3(b)(1), unless the authorization is terminated  or revoked sooner.       Influenza A by PCR NEGATIVE NEGATIVE   Influenza B by PCR NEGATIVE NEGATIVE    Comment: (NOTE) The Xpert Xpress SARS-CoV-2/FLU/RSV plus assay is intended as an aid in the diagnosis of influenza from Nasopharyngeal swab specimens and should not be used as a sole basis for treatment. Nasal washings and aspirates are unacceptable for Xpert Xpress SARS-CoV-2/FLU/RSV testing.  Fact Sheet for Patients: BloggerCourse.com  Fact Sheet for Healthcare Providers: SeriousBroker.it  This test is not yet approved or cleared by the [place] FDA  and has been authorized for detection and/or diagnosis of SARS-CoV-2 by FDA under an Emergency Use Authorization (EUA). This EUA will remain in effect (meaning this test can be used) for the duration of the COVID-19 declaration under Section 564(b)(1) of the Act, 21 U.S.C. section 360bbb-3(b)(1), unless the authorization is terminated or revoked.  Performed at [facility], [address] [place], [place] [address]   Hemoglobin A1c     Status: None   Collection Time: 05/18/21  4:23 AM  Result Value Ref Range   Hgb A1c MFr Bld 5.3 4.8 - 5.6 %    Comment: (NOTE) Pre diabetes:          5.7%-6.4%  Diabetes:              >6.4%  Glycemic control for   <7.0% adults with diabetes    Mean Plasma Glucose 105.41 mg/dL    Comment: Performed at [facility], [address] [place], [place] [address]  Lipid panel     Status: None   Collection Time: 05/18/21  4:23  AM  Result Value Ref Range   Cholesterol 147 0 - 200 mg/dL   Triglycerides 69 <742 mg/dL   HDL 51 >59 mg/dL   Total CHOL/HDL Ratio 2.9 RATIO   VLDL 14 0 - 40 mg/dL   LDL Cholesterol 82 0 - 99 mg/dL    Comment:        Total Cholesterol/HDL:CHD Risk Coronary Heart Disease Risk Table                     Men   Women  1/2 Average Risk   3.4   3.3  Average Risk       5.0   4.4  2 X Average Risk   9.6   7.1  3 X Average Risk  23.4   11.0        Use the calculated Patient Ratio above and the CHD Risk Table to determine the patient's CHD Risk.        ATP III CLASSIFICATION (LDL):  <100     mg/dL   Optimal  563-875  mg/dL   Near or Above                    Optimal  130-159  mg/dL   Borderline  643-329  mg/dL   High  >518     mg/dL   Very High Performed at [facility], [address] [place], [place] [address]   HIV Antibody (routine testing w rflx)     Status: None   Collection Time: 05/18/21  4:23 AM  Result Value Ref Range   HIV Screen 4th Generation wRfx Non Reactive Non Reactive    Comment: Performed at [facility], [address], [place], [place] [address]  Comprehensive metabolic panel     Status: Abnormal   Collection Time: 05/18/21  4:23 AM  Result Value Ref Range   Sodium 138 135 - 145 mmol/L   Potassium 3.4 (L) 3.5 - 5.1 mmol/L    Comment: DELTA CHECK NOTED   Chloride 106 98 - 111 mmol/L   CO2 26 22 - 32 mmol/L   Glucose, Bld 92 70 - 99 mg/dL    Comment: Glucose reference range applies only to samples taken after fasting for at least 8 hours.   BUN 8 8 - 23 mg/dL   Creatinine, Ser 6.01 0.44 - 1.00 mg/dL   Calcium 8.1 (L) 8.9 - 10.3 mg/dL   Total Protein 7.5 6.5 - 8.1 g/dL   Albumin 3.6 3.5 - 5.0 g/dL   AST 13 (L) 15 - 41 U/L   ALT 14 0 - 44 U/L   Alkaline Phosphatase 64 38 - 126 U/L   Total Bilirubin 2.1 (H) 0.3 - 1.2 mg/dL   GFR, Estimated >09 >32 mL/min    Comment: (NOTE) Calculated using the CKD-EPI Creatinine Equation (2021)    Anion gap 6 5 - 15    Comment: Performed at [facility], [address], [place], [place] [address]  Basic metabolic panel     Status: Abnormal   Collection Time: 05/18/21  9:51 AM  Result Value Ref Range   Sodium 136 135 - 145 mmol/L   Potassium 2.7 (LL) 3.5 - 5.1 mmol/L    Comment: CRITICAL RESULT CALLED TO, READ BACK BY AND VERIFIED WITH: HOLECOMB,R AT 10:25AM ON 05/18/21 BY FESTERMAN,C    Chloride 106 98 - 111 mmol/L   CO2 21 (L) 22 - 32 mmol/L   Glucose, Bld 98  70 - 99 mg/dL    Comment: Glucose reference range applies only to samples taken after fasting for at least 8 hours.   BUN 6 (L) 8 - 23 mg/dL   Creatinine, Ser 8.410.49 0.44 - 1.00 mg/dL   Calcium 8.3 (L) 8.9 - 10.3 mg/dL   GFR, Estimated >32>60 >44>60 mL/min    Comment: (NOTE) Calculated using the CKD-EPI Creatinine Equation (2021)    Anion gap 9 5 - 15    Comment: Performed at [facility], [address] [place], [place] [address]  CBC with Differential/Platelet     Status: None   Collection Time: 05/18/21  9:51 AM  Result Value Ref Range   WBC 8.0 4.0 - 10.5 K/uL   RBC 4.29  3.87 - 5.11 MIL/uL   Hemoglobin 13.5 12.0 - 15.0 g/dL   HCT 25.341.3 66.436.0 - 40.346.0 %   MCV 96.3 80.0 - 100.0 fL   MCH 31.5 26.0 - 34.0 pg   MCHC 32.7 30.0 - 36.0 g/dL   RDW 47.412.8 25.911.5 - 56.315.5 %   Platelets 378 150 - 400 K/uL   nRBC 0.0 0.0 - 0.2 %   Neutrophils Relative % 62 %   Neutro Abs 5.0 1.7 - 7.7 K/uL   Lymphocytes Relative 28 %   Lymphs Abs 2.3 0.7 - 4.0 K/uL   Monocytes Relative 8 %   Monocytes Absolute 0.7 0.1 - 1.0 K/uL   Eosinophils Relative 1 %   Eosinophils Absolute 0.1 0.0 - 0.5 K/uL   Basophils Relative 1 %   Basophils Absolute 0.1 0.0 - 0.1 K/uL   Immature Granulocytes 0 %   Abs Immature Granulocytes 0.02 0.00 - 0.07 K/uL    Comment: Performed at [facility], [address] [place], [place] [address]  Magnesium     Status: None   Collection Time: 05/18/21  9:51 AM  Result Value Ref Range   Magnesium 2.4 1.7 - 2.4 mg/dL    Comment: Performed at [facility], [address] [place], [place] [address]  CBG monitoring, ED     Status: None   Collection Time: 05/18/21  9:57 AM  Result Value Ref Range   Glucose-Capillary 96 70 - 99 mg/dL    Comment: Glucose reference range applies only to samples taken after fasting for at least 8 hours.  ECHOCARDIOGRAM COMPLETE     Status: None   Collection Time: 05/18/21 11:33 AM  Result Value Ref Range   Weight 2,464 oz   Height 62 in   BP 183/87 mmHg   Area-P 1/2 4.01 cm2   S' Lateral 2.70 cm  CBG monitoring, ED     Status: None   Collection Time: 05/18/21 12:46 PM  Result Value Ref Range   Glucose-Capillary 96 70 - 99 mg/dL    Comment: Glucose reference range applies only to samples taken after fasting for at least 8 hours.  C-reactive protein     Status: Abnormal   Collection Time: 05/18/21  9:14 PM  Result Value Ref Range   CRP 1.0 (H) <1.0 mg/dL    Comment: Performed at [facility], [address] [place], [place] [address]  Sedimentation rate     Status: Abnormal   Collection Time: 05/18/21  9:14 PM  Result Value Ref  Range   Sed Rate 45 (H) 0 - 22 mm/hr    Comment: Performed at [facility], [address] [place], [place] [address]  Glucose, capillary     Status: Abnormal   Collection Time:  05/19/21  1:26 AM  Result Value Ref Range   Glucose-Capillary 103 (H) 70 - 99 mg/dL    Comment: Glucose reference range applies only to samples taken after fasting for at least 8 hours.  Basic metabolic panel     Status: Abnormal   Collection Time: 05/19/21  5:30 AM  Result Value Ref Range   Sodium 144 135 - 145 mmol/L    Comment: DELTA CHECK NOTED   Potassium 5.5 (H) 3.5 - 5.1 mmol/L    Comment: DELTA CHECK NOTED   Chloride 115 (H) 98 - 111 mmol/L   CO2 22 22 - 32 mmol/L   Glucose, Bld 90 70 - 99 mg/dL    Comment: Glucose reference range applies only to samples taken after fasting for at least 8 hours.   BUN 9 8 - 23 mg/dL   Creatinine, Ser 1.320.58 0.44 - 1.00 mg/dL   Calcium 9.2 8.9 - 44.010.3 mg/dL   GFR, Estimated >10>60 >27>60 mL/min    Comment: (NOTE) Calculated using the CKD-EPI Creatinine Equation (2021)    Anion gap 7 5 - 15    Comment: Performed at [facility], [address] [place], [place] [address]  CBC with Differential/Platelet     Status: Abnormal   Collection Time: 05/19/21  5:30 AM  Result Value Ref Range   WBC 9.7 4.0 - 10.5 K/uL   RBC 3.96 3.87 - 5.11 MIL/uL   Hemoglobin 12.5 12.0 - 15.0 g/dL   HCT 44.039.9 34.736.0 - 42.546.0 %   MCV 100.8 (H) 80.0 - 100.0 fL   MCH 31.6 26.0 - 34.0 pg   MCHC 31.3 30.0 - 36.0 g/dL   RDW 95.612.8 38.711.5 - 56.415.5 %   Platelets 356 150 - 400 K/uL   nRBC 0.0 0.0 - 0.2 %   Neutrophils Relative % 52 %   Neutro Abs 5.1 1.7 - 7.7 K/uL   Lymphocytes Relative 35 %   Lymphs Abs 3.4 0.7 - 4.0 K/uL   Monocytes Relative 10 %   Monocytes Absolute 0.9 0.1 - 1.0 K/uL   Eosinophils Relative 2 %   Eosinophils Absolute 0.2 0.0 - 0.5 K/uL   Basophils Relative 1 %   Basophils Absolute 0.1 0.0 - 0.1 K/uL   Immature Granulocytes 0 %   Abs Immature Granulocytes 0.03 0.00 - 0.07 K/uL     Comment: Performed at [facility], [address] [place], [place] [address]  Glucose, capillary     Status: Abnormal   Collection Time: 05/19/21  6:03 AM  Result Value Ref Range   Glucose-Capillary 101 (H) 70 - 99 mg/dL    Comment: Glucose reference range applies only to samples taken after fasting for at least 8 hours.  Glucose, capillary     Status: Abnormal   Collection Time: 05/19/21 11:14 AM  Result Value Ref Range   Glucose-Capillary 122 (H) 70 - 99 mg/dL    Comment: Glucose reference range applies only to samples taken after fasting for at least 8 hours.  Potassium     Status: None   Collection Time: 05/19/21  1:36 PM  Result Value Ref Range   Potassium 4.7 3.5 - 5.1 mmol/L    Comment: Performed at [facility], [address] [place], [place] [address]  Glucose, capillary     Status: Abnormal   Collection Time: 05/19/21  4:12 PM  Result Value Ref Range   Glucose-Capillary 163 (H) 70 - 99 mg/dL    Comment: Glucose reference range applies only to  samples taken after fasting for at least 8 hours.   July 29,2019: Surgical pathology-- negative for malignancy.  Results for MEGHANNE, STEEGE (MRN [id number]) as of 07/27/2021 09:20  Ref. Range 11/04/2018 13:24 06/02/2019 09:56 12/04/2019 11:01 06/21/2020 11:18 01/03/2021 10:50  TSH Latest Ref Range: 0.450 - 4.500 uIU/mL 0.07 (L) 2.28 0.59 2.360 0.438 (L)  T4,Free(Direct) Latest Ref Range: 0.82 - 1.77 ng/dL 1.7 1.1 1.3 1.12 1.37     Assessment & Plan:   1. Post Surgical Hypothyroidism r/t MNG  Her previsit thyroid function tests are consistent with appropriate replacement, however given her complaint of hand tremors, will go ahead and rule out thyroid involvement by rechecking today.  She says she also has a follow up appointment with her neurologist for the hand tremors as well.  She is advised to continue Levothyroxine 75 mcg p.o. daily before breakfast.  If labs are stable at next 6 month check up, can push F/u appt out to  annually.   - We discussed about the correct intake of her thyroid hormone, on empty stomach at fasting, with water, separated by at least 30 minutes from breakfast and other medications,  and separated by more than 4 hours from calcium, iron, multivitamins, acid reflux medications (PPIs). -Patient is made aware of the fact that thyroid hormone replacement is needed for life, dose to be adjusted by periodic monitoring of thyroid function tests.     - I advised her  to maintain close follow up with Bridget Common Jarvis Newcomer, MD for primary care needs.    I spent 20 minutes in the care of the patient today including review of labs from Thyroid Function, CMP, and other relevant labs ; imaging/biopsy records (current and previous including abstractions from other facilities); face-to-face time discussing  her lab results and symptoms, medications doses, her options of short and long term treatment based on the latest standards of care / guidelines;   and documenting the encounter.  Bridget Woods  participated in the discussions, expressed understanding, and voiced agreement with the above plans.  All questions were answered to her satisfaction. she is encouraged to contact clinic should she have any questions or concerns prior to her return visit.   Follow up plan: Return in about 6 months (around 01/24/2022) for Thyroid follow up, labs today and then again 1 week prior to next appt.   Rayetta Pigg, [facility] [address] [place], [place] [address] Phone: [phone] Fax: [phone]   07/27/2021, 9:29 AM

## 2021-07-28 LAB — TSH: TSH: 2.49 u[IU]/mL (ref 0.450–4.500)

## 2021-07-28 LAB — T4, FREE: Free T4: 1.1 ng/dL (ref 0.82–1.77)

## 2021-09-08 ENCOUNTER — Other Ambulatory Visit: Payer: Self-pay | Admitting: "Endocrinology

## 2021-09-08 DIAGNOSIS — E890 Postprocedural hypothyroidism: Secondary | ICD-10-CM

## 2021-11-01 IMAGING — US US CAROTID DUPLEX BILAT
1 series · 13 of 24 positions shown · non-contrast
Comparison: None.

CLINICAL DATA: Intracranial aneurysm diagnosed on recent CT
angiography of the head

EXAM:
BILATERAL CAROTID DUPLEX ULTRASOUND
TECHNIQUE: Gray scale imaging, color Doppler and duplex ultrasound were
performed of bilateral carotid and vertebral arteries in the neck.

[Series 1: us carotid bilateral · 13 of 69 slices shown]
[im 1/69]
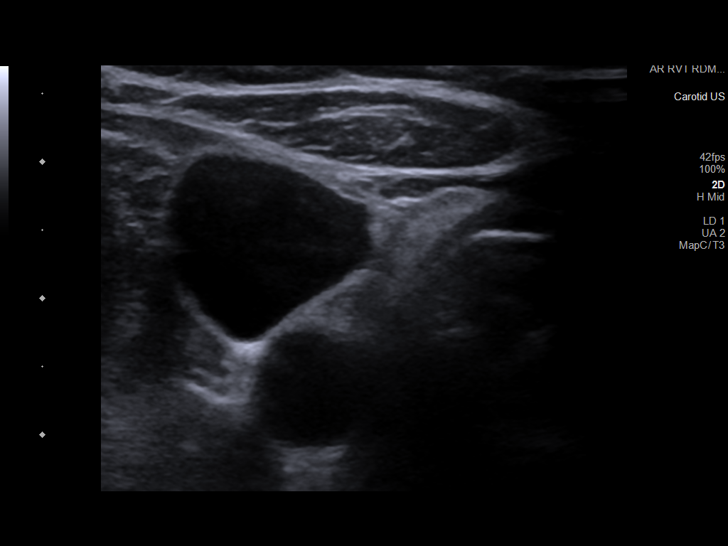
[im 6/69]
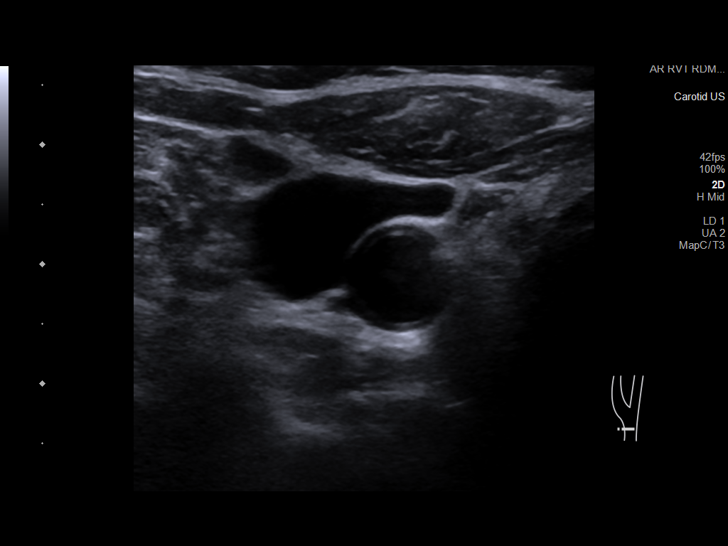
[im 12/69]
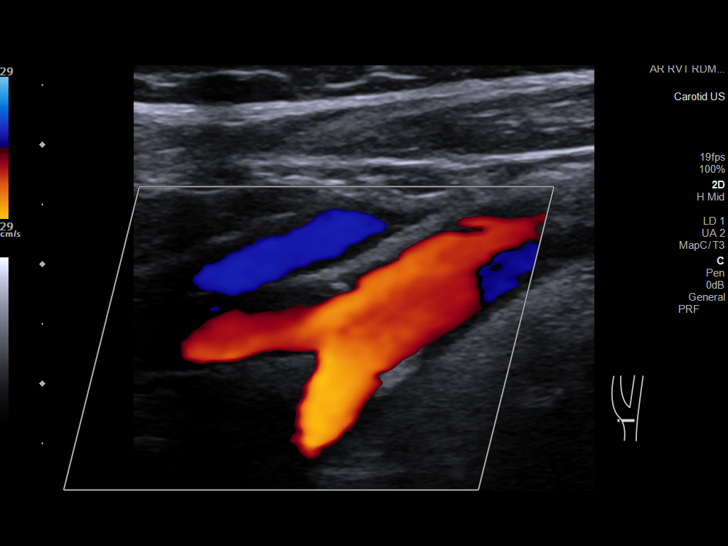
[im 18/69]
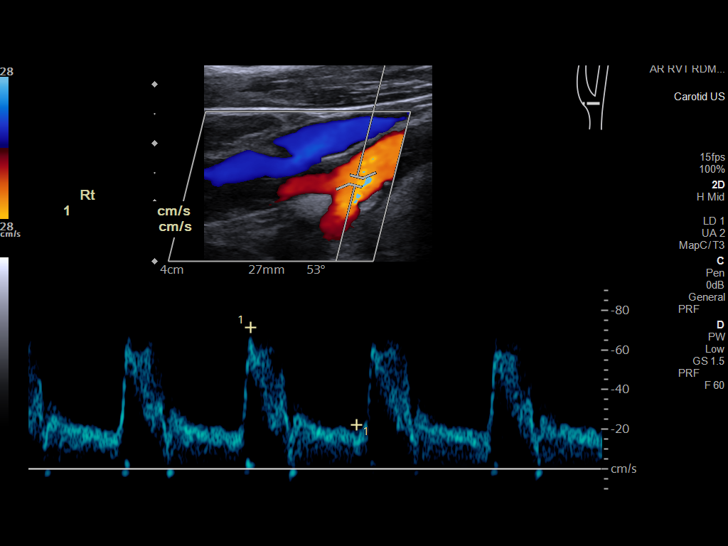
[im 24/69]
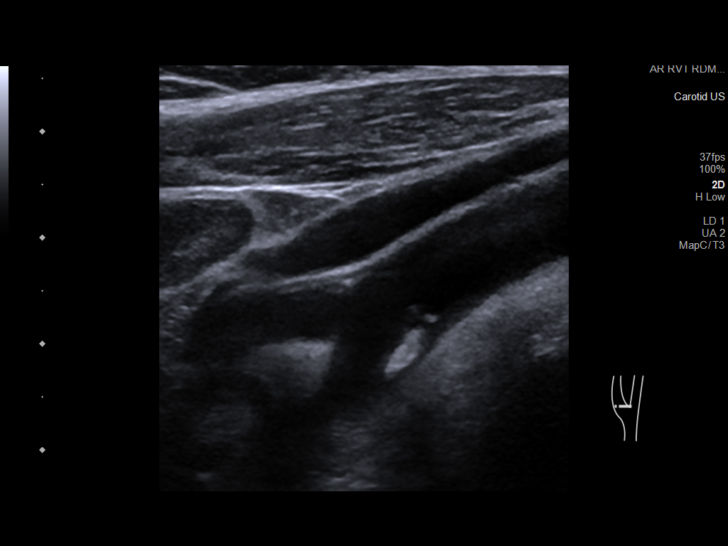
[im 30/69]
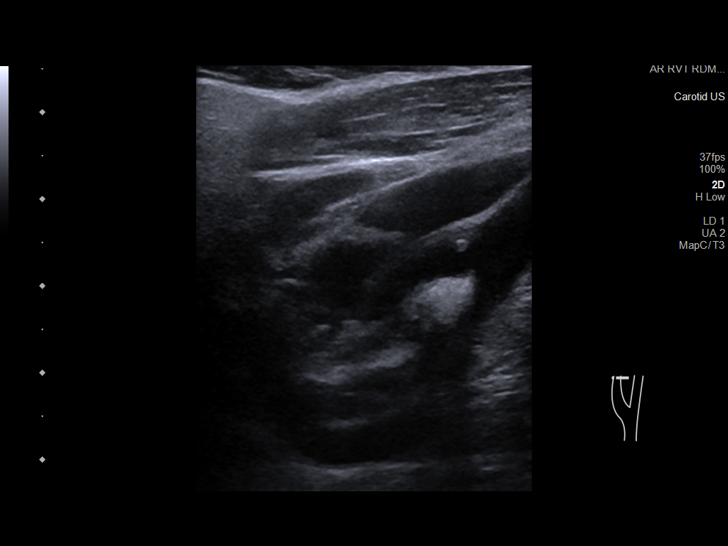
[im 36/69]
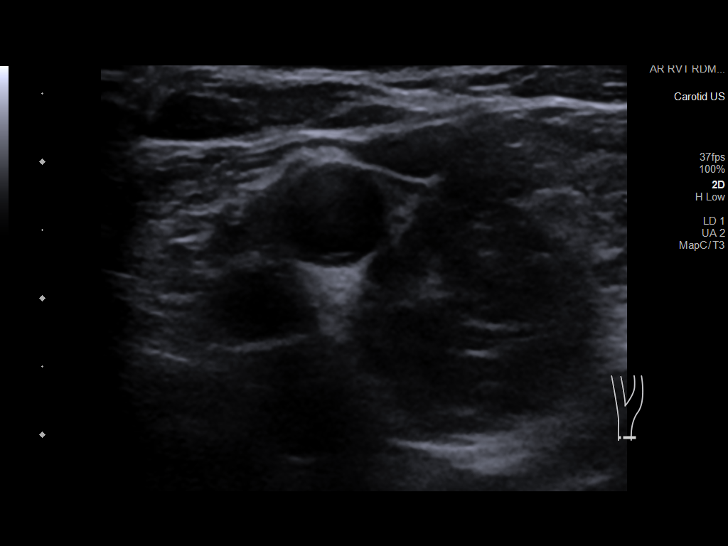
[im 39/69]
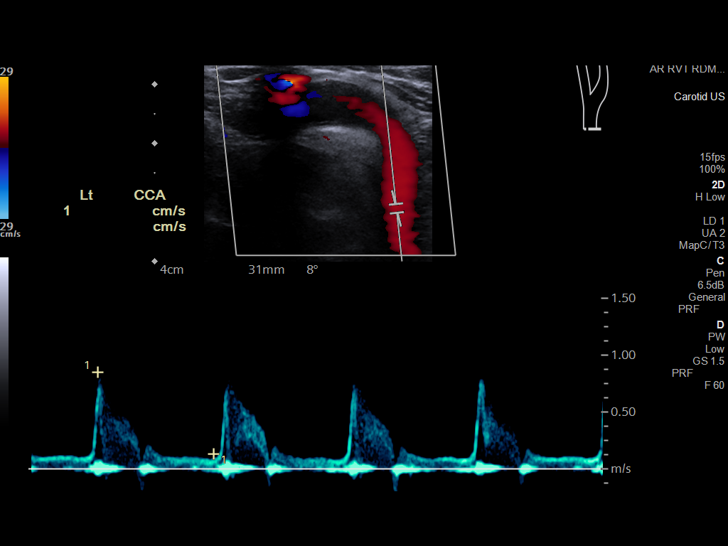
[im 45/69]
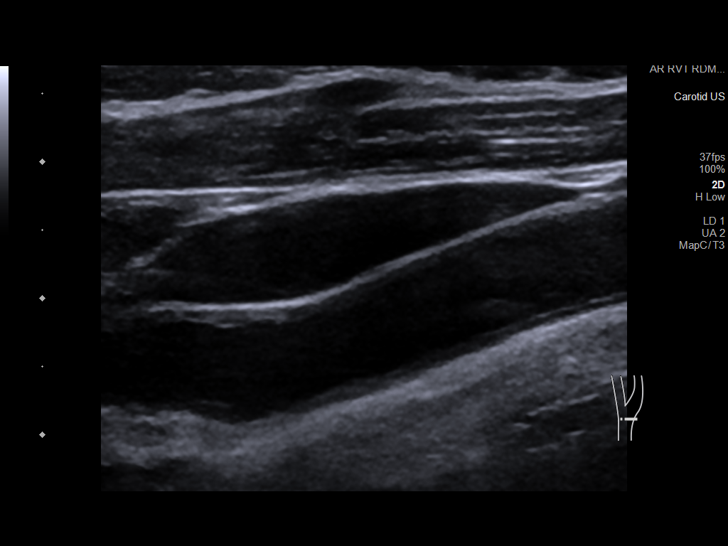
[im 51/69]
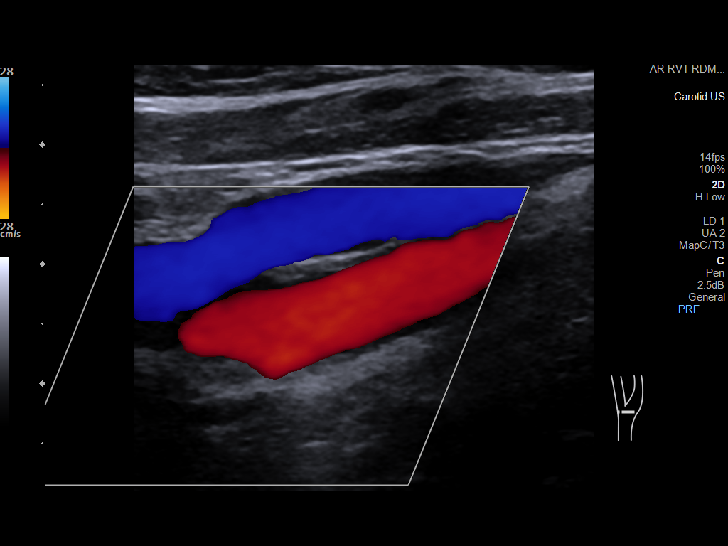
[im 57/69]
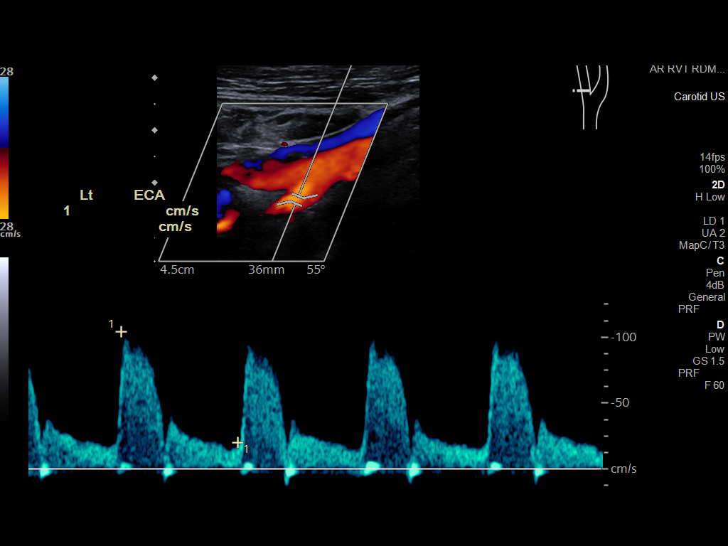
[im 63/69]
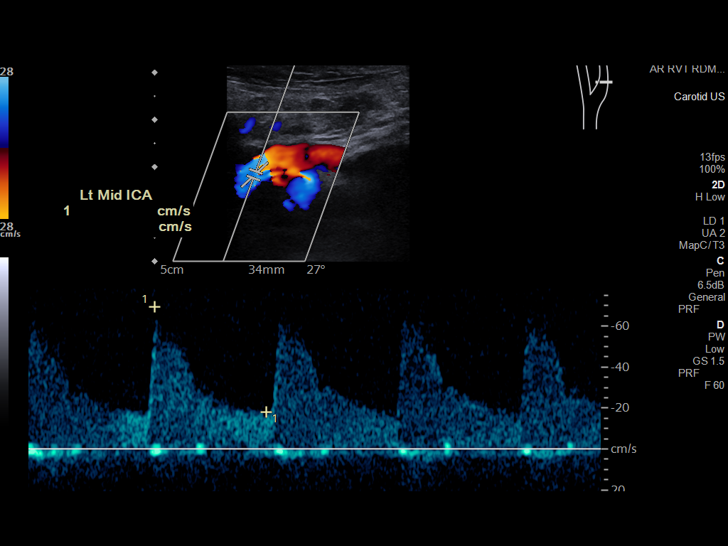
[im 69/69]
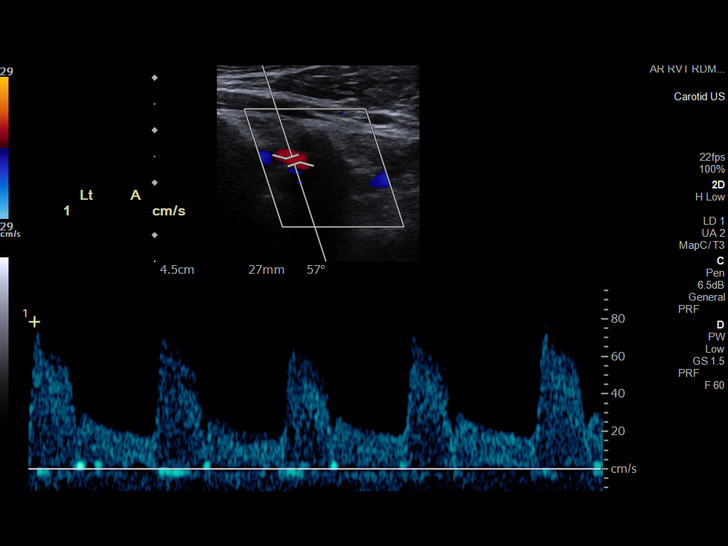

[13 of 24 positions shown; findings below may reference images not displayed]

FINDINGS: Criteria: Quantification of carotid stenosis is based on velocity
parameters that correlate the residual internal carotid diameter
with NASCET-based stenosis levels, using the diameter of the distal
internal carotid lumen as the denominator for stenosis measurement.

The following velocity measurements were obtained:

RIGHT

ICA: 87/28 cm/sec

CCA: 66/16 cm/sec

SYSTOLIC ICA/CCA RATIO:

ECA: 69 cm/sec

LEFT

ICA: 76/25 cm/sec

CCA: 80/18 cm/sec

SYSTOLIC ICA/CCA RATIO:

ECA: 104 cm/sec

RIGHT CAROTID ARTERY: Mild atherosclerotic plaque in the bulb
results in no significant stenosis. Normal low resistance waveform
in internal carotid artery.

RIGHT VERTEBRAL ARTERY:  Antegrade flow

LEFT CAROTID ARTERY: No significant atherosclerotic plaque. Normal
low resistance waveform in the internal carotid artery.

LEFT VERTEBRAL ARTERY:  Antegrade flow
IMPRESSION: No evidence of hemodynamically significant stenosis involving either
the right or left carotid circulation in the neck by Doppler
criteria. The visualized portions of carotid arteries in the neck
are normal in caliber without significant stenosis.

## 2022-01-24 ENCOUNTER — Ambulatory Visit: Payer: Medicare HMO | Admitting: Nurse Practitioner

## 2022-01-30 ENCOUNTER — Ambulatory Visit: Payer: Medicare HMO | Admitting: Nurse Practitioner

## 2022-01-30 NOTE — Patient Instructions (Incomplete)

## 2022-01-31 LAB — TSH: TSH: 4.53 u[IU]/mL — ABNORMAL HIGH (ref 0.450–4.500)

## 2022-01-31 LAB — T4, FREE: Free T4: 1.18 ng/dL (ref 0.82–1.77)

## 2022-02-16 ENCOUNTER — Encounter: Payer: Self-pay | Admitting: Nurse Practitioner

## 2022-02-16 ENCOUNTER — Ambulatory Visit: Payer: Medicare HMO | Admitting: Nurse Practitioner

## 2022-02-16 VITALS — BP 145/79 | HR 58 | Ht 62.0 in | Wt 157.0 lb

## 2022-02-16 DIAGNOSIS — E890 Postprocedural hypothyroidism: Secondary | ICD-10-CM

## 2022-02-16 MED ORDER — LEVOTHYROXINE SODIUM 75 MCG PO TABS: 75.0000 ug | ORAL_TABLET | Freq: Every day | ORAL | 3 refills | Status: DC

## 2022-02-16 NOTE — Progress Notes (Signed)
02/16/2022, 11:20 AM      Endocrinology follow-up note                      Subjective:    Patient ID: Bridget Woods, female    DOB: [birth year], PCP Reynaldo Minium Franki Cabot, MD   Past Medical History:  Diagnosis Date   Anxiety    Arthritis    RA   Depression    Fibromyalgia    GERD (gastroesophageal reflux disease)    Headache    Hypertension    Hyperthyroidism    Past Surgical History:  Procedure Laterality Date   ABDOMINAL HYSTERECTOMY     BREAST SURGERY Left    removal of benign tumor   HERNIA REPAIR     Umbilical   THYROIDECTOMY N/A 04/14/2018   Procedure: TOTAL THYROIDECTOMY;  Surgeon: Franky Macho, MD;  Location: [facility];  Service: General;  Laterality: N/A;   Social History   Socioeconomic History   Marital status: Married    Spouse name: Not on file   Number of children: Not on file   Years of education: Not on file   Highest education level: Not on file  Occupational History   Not on file  Tobacco Use   Smoking status: Never   Smokeless tobacco: Never  Vaping Use   Vaping Use: Never used  Substance and Sexual Activity   Alcohol use: Not Currently   Drug use: Never   Sexual activity: Yes    Birth control/protection: Surgical  Other Topics Concern   Not on file  Social History Narrative   Not on file   Social Determinants of Health   Financial Resource Strain: Not on file  Food Insecurity: Not on file  Transportation Needs: Not on file  Physical Activity: Not on file  Stress: Not on file  Social Connections: Not on file   Outpatient Encounter Medications as of 02/16/2022  Medication Sig   ALPRAZolam (XANAX) 0.5 MG tablet Take 0.5 mg by mouth 3 (three) times daily as needed for anxiety.   aspirin EC 81 MG tablet Take 81 mg by mouth at bedtime.    aspirin-acetaminophen-caffeine (EXCEDRIN MIGRAINE) 250-250-65 MG tablet Take 2 tablets by mouth 2 (two) times daily as needed for headache or  migraine.   atorvastatin (LIPITOR) 10 MG tablet Take 10 mg by mouth daily.   calcium-vitamin D (OSCAL WITH D) 500-200 MG-UNIT tablet Take 1 tablet by mouth 2 (two) times daily.   dicyclomine (BENTYL) 10 MG capsule Take 10 mg by mouth 3 (three) times daily.   diltiazem (CARDIZEM CD) 180 MG 24 hr capsule Take 180 mg by mouth daily.   DULoxetine (CYMBALTA) 60 MG capsule Take 60 mg by mouth 2 (two) times daily.   ENBREL MINI 50 MG/ML SOCT Inject into the skin.   HYDROcodone-acetaminophen (NORCO) 5-325 MG tablet Take 1 tablet by mouth every 4 (four) hours as needed for moderate pain. (Patient not taking: Reported on 07/27/2021)   levothyroxine (SYNTHROID) 75 MCG tablet Take 1 tablet (75 mcg total) by mouth daily before breakfast.   losartan (COZAAR) 50 MG tablet Take 50 mg by mouth daily.   methylPREDNISolone (MEDROL DOSEPAK) 4 MG TBPK tablet Take as  directed (Patient not taking: Reported on 07/27/2021)   pantoprazole (PROTONIX) 40 MG tablet Take 40 mg by mouth daily.   potassium chloride SA (K-DUR,KLOR-CON) 20 MEQ tablet Take 20 mEq by mouth 2 (two) times daily.   predniSONE (DELTASONE) 5 MG tablet Take 5 mg by mouth daily as needed. For rheumatoid arthritis flare ups   propranolol (INDERAL) 40 MG tablet Take 60 mg by mouth 2 (two) times daily.   QUEtiapine (SEROQUEL) 25 MG tablet Take 1 tablet by mouth at bedtime.   QULIPTA 60 MG TABS Take 1 tablet by mouth at bedtime.   ranitidine (ZANTAC) 150 MG tablet Take 150 mg by mouth 2 (two) times daily. (Patient not taking: No sig reported)   tiZANidine (ZANAFLEX) 4 MG tablet Take 4 mg by mouth See admin instructions. Take 1 tablet (4 mg) in the morning & 2 tablets (8 mg) at bedtime.   TRULANCE 3 MG TABS Take 1 tablet by mouth daily.   UBRELVY 100 MG TABS Take 1 tablet by mouth daily as needed (headache).   [DISCONTINUED] hydrOXYzine (ATARAX/VISTARIL) 10 MG tablet Take 10 mg by mouth 2 (two) times daily.   [DISCONTINUED] levothyroxine (SYNTHROID) 75 MCG  tablet TAKE 1 TABLET  BY MOUTH DAILY BEFORE BREAKFAST   No facility-administered encounter medications on file as of 02/16/2022.   ALLERGIES: Allergies  Allergen Reactions   Naproxen Other (See Comments)    Contact Dermatitis--oral blisters   Cephalexin     YEAST INFECTION   Ciprofibrate Nausea Only    VACCINATION STATUS:  There is no immunization history on file for this patient.  HPI Bridget Woods is 66 y.o. 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  She is returning for follow-up for postsurgical hypothyroidism.  She is currently on levothyroxine 75 mcg p.o. daily before breakfast.  She has been taking it with her other medications.  She is concerned about her recent weight gain.   She is status post total thyroidectomy on April 14, 2018 with benign outcomes.    Review of systems  Constitutional: + Minimally fluctuating body weight,  current Body mass index is 28.72 kg/m. , no fatigue, no subjective hyperthermia, no subjective hypothermia Eyes: no blurry vision, no xerophthalmia ENT: no sore throat, no nodules palpated in throat, no dysphagia/odynophagia, no hoarseness Cardiovascular: no chest pain, no shortness of breath, no palpitations, no leg swelling Respiratory: no cough, no shortness of breath Gastrointestinal: no nausea/vomiting/diarrhea Musculoskeletal: no muscle/joint aches Skin: no rashes, no hyperemia Neurological: no tremors, no numbness, no tingling, no dizziness Psychiatric: no depression, no anxiety   Objective:    BP (!) 145/79   Pulse (!) 58   Ht 5\' 2"  (1.575 m)   Wt 157 lb (71.2 kg)   BMI 28.72 kg/m   Wt Readings from Last 3 Encounters:  02/16/22 157 lb (71.2 kg)  07/27/21 166 lb (75.3 kg)  05/17/21 154 lb (69.9 kg)    BP Readings from Last 3 Encounters:  02/16/22 (!) 145/79  07/27/21 127/78  05/19/21 (!) 156/88     Physical Exam- Limited  Constitutional:  Body mass index is 28.72 kg/m.  , not in acute distress, normal state of mind Eyes:  EOMI, no exophthalmos Neck: Supple Cardiovascular: RRR, no murmurs, rubs, or gallops, no edema Respiratory: Adequate breathing efforts, no crackles, rales, rhonchi, or wheezing Musculoskeletal: no gross deformities, strength intact in all four extremities, no gross restriction of joint movements Skin:  no rashes, no hyperemia Neurological:  no tremor with outstretched hands   Recent Results (from the past 2160 hour(s))  TSH     Status: Abnormal   Collection Time: 01/30/22 10:53 AM  Result Value Ref Range   TSH 4.530 (H) 0.450 - 4.500 uIU/mL  T4, free     Status: None   Collection Time: 01/30/22 10:53 AM  Result Value Ref Range   Free T4 1.18 0.82 - 1.77 ng/dL   July [id number]: Surgical pathology-- negative for malignancy.    Latest Reference Range & Units 12/04/19 11:01 06/21/20 11:18 01/03/21 10:50 07/27/21 09:44 01/30/22 10:53  TSH 0.450 - 4.500 uIU/mL 0.59 2.360 0.438 (L) 2.490 4.530 (H)  T4,Free(Direct) 0.82 - 1.77 ng/dL 1.3 0.981.12 1.191.37 1.471.10 8.291.18  (L): Data is abnormally low (H): Data is abnormally high   Assessment & Plan:   1. Post Surgical Hypothyroidism r/t MNG  Her previsit thyroid function tests are consistent with appropriate replacement (TSH slightly elevated with normal FT4).  She admits she has been taking all her medications at the same time likely affecting absorption.  She is advised to continue Levothyroxine 75 mcg p.o. daily before breakfast.  If labs are stable at next 6 month check up, can push F/u appt out to annually.   - We discussed about the correct intake of her thyroid hormone, on empty stomach at fasting, with water, separated by at least 30 minutes from breakfast and other medications,  and separated by more than 4 hours from calcium, iron, multivitamins, acid reflux medications (PPIs). -Patient is made aware of the fact that thyroid hormone replacement is needed for life, dose to be adjusted by  periodic monitoring of thyroid function tests.     - I advised her to maintain close follow up with Reynaldo MiniumPallone, Franki CabotAmanda W, MD for primary care needs.     I spent 22 minutes in the care of the patient today including review of labs from Thyroid Function, CMP, and other relevant labs ; imaging/biopsy records (current and previous including abstractions from other facilities); face-to-face time discussing  her lab results and symptoms, medications doses, her options of short and long term treatment based on the latest standards of care / guidelines;   and documenting the encounter.  Bridget FiddlerMaggie W Hoar  participated in the discussions, expressed understanding, and voiced agreement with the above plans.  All questions were answered to her satisfaction. she is encouraged to contact clinic should she have any questions or concerns prior to her return visit.   Follow up plan: Return in about 6 months (around 08/18/2022) for Thyroid follow up, Previsit labs.   Ronny BaconWhitney Jameia Makris, [facility] HospitalFNP-BC [facility] [address] [place], [place] [address] Phone: [phone] Fax: [phone]   02/16/2022, 11:20 AM

## 2022-02-16 NOTE — Patient Instructions (Signed)

## 2022-08-20 NOTE — Patient Instructions (Incomplete)

## 2022-08-21 ENCOUNTER — Ambulatory Visit: Payer: Medicare HMO | Admitting: Nurse Practitioner

## 2022-09-24 NOTE — Patient Instructions (Signed)

## 2022-09-26 ENCOUNTER — Encounter: Payer: Self-pay | Admitting: Nurse Practitioner

## 2022-09-26 ENCOUNTER — Ambulatory Visit (INDEPENDENT_AMBULATORY_CARE_PROVIDER_SITE_OTHER): Payer: Medicare HMO | Admitting: Nurse Practitioner

## 2022-09-26 VITALS — BP 160/88 | HR 73 | Ht 62.0 in | Wt 159.4 lb

## 2022-09-26 DIAGNOSIS — E890 Postprocedural hypothyroidism: Secondary | ICD-10-CM

## 2022-09-26 MED ORDER — LEVOTHYROXINE SODIUM 88 MCG PO TABS: 88.0000 ug | ORAL_TABLET | Freq: Every day | ORAL | 3 refills | Status: DC

## 2022-09-26 NOTE — Progress Notes (Signed)
09/26/2022, 11:19 AM      Endocrinology follow-up note                      Subjective:    Patient ID: Bridget Woods, female    DOB: 1956-08-07, PCP Margo Common Jarvis Newcomer, MD   Past Medical History:  Diagnosis Date   Anxiety    Arthritis    RA   Depression    Fibromyalgia    GERD (gastroesophageal reflux disease)    Headache    Hypertension    Hyperthyroidism    Past Surgical History:  Procedure Laterality Date   ABDOMINAL HYSTERECTOMY     BREAST SURGERY Left    removal of benign tumor   HERNIA REPAIR     Umbilical   THYROIDECTOMY N/A 04/14/2018   Procedure: TOTAL THYROIDECTOMY;  Surgeon: Aviva Signs, MD;  Location: [facility];  Service: General;  Laterality: N/A;   Social History   Socioeconomic History   Marital status: Married    Spouse name: Not on file   Number of children: Not on file   Years of education: Not on file   Highest education level: Not on file  Occupational History   Not on file  Tobacco Use   Smoking status: Never   Smokeless tobacco: Never  Vaping Use   Vaping Use: Never used  Substance and Sexual Activity   Alcohol use: Not Currently   Drug use: Never   Sexual activity: Yes    Birth control/protection: Surgical  Other Topics Concern   Not on file  Social History Narrative   Not on file   Social Determinants of Health   Financial Resource Strain: Not on file  Food Insecurity: Not on file  Transportation Needs: Not on file  Physical Activity: Not on file  Stress: Not on file  Social Connections: Not on file   Outpatient Encounter Medications as of 09/26/2022  Medication Sig   ALPRAZolam (XANAX) 0.5 MG tablet Take 0.5 mg by mouth 3 (three) times daily as needed for anxiety.   aspirin EC 81 MG tablet Take 81 mg by mouth at bedtime.    aspirin-acetaminophen-caffeine (EXCEDRIN MIGRAINE) 250-250-65 MG tablet Take 2 tablets by mouth 2 (two) times daily as needed for headache or  migraine.   atorvastatin (LIPITOR) 10 MG tablet Take 10 mg by mouth daily.   calcium-vitamin D (OSCAL WITH D) 500-200 MG-UNIT tablet Take 1 tablet by mouth 2 (two) times daily.   diltiazem (CARDIZEM CD) 180 MG 24 hr capsule Take 180 mg by mouth daily.   DULoxetine (CYMBALTA) 60 MG capsule Take 60 mg by mouth 2 (two) times daily.   ENBREL MINI 50 MG/ML SOCT Inject into the skin.   HYDROcodone-acetaminophen (NORCO) 5-325 MG tablet Take 1 tablet by mouth every 4 (four) hours as needed for moderate pain.   losartan (COZAAR) 50 MG tablet Take 50 mg by mouth daily.   pantoprazole (PROTONIX) 40 MG tablet Take 40 mg by mouth daily.   potassium chloride SA (K-DUR,KLOR-CON) 20 MEQ tablet Take 20 mEq by mouth 2 (two) times daily.   predniSONE (DELTASONE) 5 MG tablet Take 5 mg by mouth daily as needed. For rheumatoid arthritis flare ups  propranolol (INDERAL) 40 MG tablet Take 60 mg by mouth 2 (two) times daily.   QUEtiapine (SEROQUEL) 25 MG tablet Take 1 tablet by mouth at bedtime.   QULIPTA 60 MG TABS Take 1 tablet by mouth at bedtime.   tiZANidine (ZANAFLEX) 4 MG tablet Take 4 mg by mouth See admin instructions. Take 1 tablet (4 mg) in the morning & 2 tablets (8 mg) at bedtime.   TRULANCE 3 MG TABS Take 1 tablet by mouth daily.   UBRELVY 100 MG TABS Take 1 tablet by mouth daily as needed (headache).   [DISCONTINUED] levothyroxine (SYNTHROID) 75 MCG tablet Take 1 tablet (75 mcg total) by mouth daily before breakfast.   levothyroxine (SYNTHROID) 88 MCG tablet Take 1 tablet (88 mcg total) by mouth daily before breakfast.   [DISCONTINUED] dicyclomine (BENTYL) 10 MG capsule Take 10 mg by mouth 3 (three) times daily.   [DISCONTINUED] methylPREDNISolone (MEDROL DOSEPAK) 4 MG TBPK tablet Take as directed (Patient not taking: Reported on 07/27/2021)   [DISCONTINUED] ranitidine (ZANTAC) 150 MG tablet Take 150 mg by mouth 2 (two) times daily. (Patient not taking: No sig reported)   No facility-administered  encounter medications on file as of 09/26/2022.   ALLERGIES: Allergies  Allergen Reactions   Naproxen Other (See Comments)    Contact Dermatitis--oral blisters   Cephalexin     YEAST INFECTION   Ciprofibrate Nausea Only    VACCINATION STATUS:  There is no immunization history on file for this patient.  HPI Bridget Woods is 67 y.o. 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  She is returning for follow-up for postsurgical hypothyroidism.  She is currently on levothyroxine 88 mcg p.o. daily before breakfast (PCP increased between visits).     She is status post total thyroidectomy on April 14, 2018 with benign outcomes.   Review of systems  Constitutional: + Minimally fluctuating body weight,  current Body mass index is 29.15 kg/m. , no fatigue, no subjective hyperthermia, no subjective hypothermia Eyes: no blurry vision, no xerophthalmia ENT: no sore throat, no nodules palpated in throat, no dysphagia/odynophagia, no hoarseness Cardiovascular: no chest pain, no shortness of breath, no palpitations, no leg swelling Respiratory: no cough, no shortness of breath Gastrointestinal: no nausea/vomiting/diarrhea Musculoskeletal: + knee pain Skin: no rashes, no hyperemia Neurological: no tremors, no numbness, no tingling, no dizziness Psychiatric: no depression, no anxiety   Objective:    BP (!) 160/88 Comment: Taken with manuel BP cuff- patient states that she will tke her BP medication when she gets home.  Pulse 73   Ht 5\' 2"  (4.034 m)   Wt 159 lb 6.4 oz (72.3 kg)   BMI 29.15 kg/m   Wt Readings from Last 3 Encounters:  09/26/22 159 lb 6.4 oz (72.3 kg)  02/16/22 157 lb (71.2 kg)  07/27/21 166 lb (75.3 kg)    BP Readings from Last 3 Encounters:  09/26/22 (!) 160/88  02/16/22 (!) 145/79  07/27/21 127/78    Physical Exam- Limited  Constitutional:  Body mass index is 29.15 kg/m. , not in acute distress, normal state of  mind Eyes:  EOMI, no exophthalmos Musculoskeletal: no gross deformities, strength intact in all four extremities, no gross restriction of joint movements Skin:  no rashes, no hyperemia Neurological: no tremor with outstretched hands   No results found for this or any previous visit (from the past 2160 hour(s)).  July 29,2019: Surgical pathology-- negative for malignancy.    Latest Reference Range &  Units 06/02/19 09:56 12/04/19 11:01 06/21/20 11:18 01/03/21 10:50 07/27/21 09:44 01/30/22 10:53  TSH 0.450 - 4.500 uIU/mL 2.28 0.59 2.360 0.438 (L) 2.490 4.530 (H)  T4,Free(Direct) 0.82 - 1.77 ng/dL 1.1 1.3 8.41 3.24 4.01 1.18  (L): Data is abnormally low (H): Data is abnormally high  Recent labs from [facility] 07/23/22 TSH-1.8 FT4-0.87 Assessment & Plan:   1. Post Surgical Hypothyroidism r/t MNG  Her previsit thyroid function tests are consistent with appropriate hormone replacement She is advised to continue Levothyroxine 88 mcg p.o. daily before breakfast.     - We discussed about the correct intake of her thyroid hormone, on empty stomach at fasting, with water, separated by at least 30 minutes from breakfast and other medications,  and separated by more than 4 hours from calcium, iron, multivitamins, acid reflux medications (PPIs). -Patient is made aware of the fact that thyroid hormone replacement is needed for life, dose to be adjusted by periodic monitoring of thyroid function tests.     - I advised her to maintain close follow up with Reynaldo Minium Franki Cabot, MD for primary care needs.     I spent 20 minutes in the care of the patient today including review of labs from Thyroid Function, CMP, and other relevant labs ; imaging/biopsy records (current and previous including abstractions from other facilities); face-to-face time discussing  her lab results and symptoms, medications doses, her options of short and long term treatment based on the latest standards of care /  guidelines;   and documenting the encounter.  Melina Fiddler  participated in the discussions, expressed understanding, and voiced agreement with the above plans.  All questions were answered to her satisfaction. she is encouraged to contact clinic should she have any questions or concerns prior to her return visit.   Follow up plan: Return in about 1 year (around 09/27/2023) for Thyroid follow up, Previsit labs.   Ronny Bacon, [facility] [address] [place], [place] [address] Phone: [phone] Fax: [phone]   09/26/2022, 11:19 AM

## 2023-09-26 NOTE — Patient Instructions (Signed)

## 2023-09-27 ENCOUNTER — Encounter: Payer: Self-pay | Admitting: Nurse Practitioner

## 2023-09-27 ENCOUNTER — Ambulatory Visit: Payer: Medicare HMO | Admitting: Nurse Practitioner

## 2023-09-27 VITALS — BP 146/75 | HR 66 | Ht 62.0 in | Wt 140.2 lb

## 2023-09-27 DIAGNOSIS — E890 Postprocedural hypothyroidism: Secondary | ICD-10-CM | POA: Diagnosis not present

## 2023-09-27 LAB — TSH: TSH: 1.17 u[IU]/mL (ref 0.450–4.500)

## 2023-09-27 LAB — T4, FREE: Free T4: 1.85 ng/dL — ABNORMAL HIGH (ref 0.82–1.77)

## 2023-09-27 MED ORDER — LEVOTHYROXINE SODIUM 88 MCG PO TABS: 88.0000 ug | ORAL_TABLET | Freq: Every day | ORAL | 3 refills | Status: DC

## 2023-09-27 NOTE — Progress Notes (Addendum)
 09/27/2023, 11:03 AM      Endocrinology follow-up note                      Subjective:    Patient ID: Bridget Woods, female    DOB: 20-Nov-1955, PCP Hollis Alan LELON, MD   Past Medical History:  Diagnosis Date   Anxiety    Arthritis    RA   Depression    Fibromyalgia    GERD (gastroesophageal reflux disease)    Headache    Hypertension    Hyperthyroidism    Past Surgical History:  Procedure Laterality Date   ABDOMINAL HYSTERECTOMY     BREAST SURGERY Left    removal of benign tumor   HERNIA REPAIR     Umbilical   THYROIDECTOMY N/A 04/14/2018   Procedure: TOTAL THYROIDECTOMY;  Surgeon: Mavis Anes, MD;  Location: [facility];  Service: General;  Laterality: N/A;   Social History   Socioeconomic History   Marital status: Married    Spouse name: Not on file   Number of children: Not on file   Years of education: Not on file   Highest education level: Not on file  Occupational History   Not on file  Tobacco Use   Smoking status: Never   Smokeless tobacco: Never  Vaping Use   Vaping status: Never Used  Substance and Sexual Activity   Alcohol use: Not Currently   Drug use: Never   Sexual activity: Yes    Birth control/protection: Surgical  Other Topics Concern   Not on file  Social History Narrative   Not on file   Social Drivers of Health   Financial Resource Strain: Not on file  Food Insecurity: Not on file  Transportation Needs: Not on file  Physical Activity: Not on file  Stress: Not on file  Social Connections: Not on file   Outpatient Encounter Medications as of 09/27/2023  Medication Sig   ALPRAZolam  (XANAX ) 0.5 MG tablet Take 0.5 mg by mouth 3 (three) times daily as needed for anxiety.   aspirin  EC 81 MG tablet Take 81 mg by mouth at bedtime.    aspirin -acetaminophen -caffeine (EXCEDRIN MIGRAINE) 250-250-65 MG tablet Take 2 tablets by mouth 2 (two) times daily as needed for headache or  migraine.   atorvastatin  (LIPITOR) 10 MG tablet Take 10 mg by mouth daily.   calcium -vitamin D  (OSCAL WITH D) 500-200 MG-UNIT tablet Take 1 tablet by mouth 2 (two) times daily.   diltiazem  (CARDIZEM  CD) 180 MG 24 hr capsule Take 180 mg by mouth daily.   DULoxetine  (CYMBALTA ) 60 MG capsule Take 60 mg by mouth 2 (two) times daily.   ENBREL MINI 50 MG/ML SOCT Inject into the skin.   HYDROcodone -acetaminophen  (NORCO) 5-325 MG tablet Take 1 tablet by mouth every 4 (four) hours as needed for moderate pain.   losartan  (COZAAR ) 50 MG tablet Take 50 mg by mouth daily.   pantoprazole (PROTONIX) 40 MG tablet Take 40 mg by mouth daily.   potassium chloride  SA (K-DUR,KLOR-CON ) 20 MEQ tablet Take 20 mEq by mouth 2 (two) times daily.   predniSONE (DELTASONE) 5 MG tablet Take 5 mg by mouth daily as needed. For rheumatoid arthritis flare ups  propranolol  (INDERAL ) 40 MG tablet Take 60 mg by mouth 2 (two) times daily.   QUEtiapine  (SEROQUEL ) 25 MG tablet Take 1 tablet by mouth at bedtime.   QULIPTA  60 MG TABS Take 1 tablet by mouth at bedtime.   tiZANidine  (ZANAFLEX ) 4 MG tablet Take 4 mg by mouth See admin instructions. Take 1 tablet (4 mg) in the morning & 2 tablets (8 mg) at bedtime.   TRULANCE 3 MG TABS Take 1 tablet by mouth daily.   UBRELVY 100 MG TABS Take 1 tablet by mouth daily as needed (headache).   [DISCONTINUED] levothyroxine  (SYNTHROID ) 88 MCG tablet Take 1 tablet (88 mcg total) by mouth daily before breakfast.   levothyroxine  (SYNTHROID ) 88 MCG tablet Take 1 tablet (88 mcg total) by mouth daily before breakfast.   No facility-administered encounter medications on file as of 09/27/2023.   ALLERGIES: Allergies  Allergen Reactions   Naproxen Other (See Comments)    Contact Dermatitis--oral blisters   Cephalexin     YEAST INFECTION   Ciprofibrate Nausea Only    VACCINATION STATUS:  There is no immunization history on file for this patient.  HPI Bridget Woods is 68 y.o. 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  She is returning for follow-up for postsurgical hypothyroidism.  She is currently on levothyroxine  88 mcg p.o. daily before breakfast (PCP increased between visits).     She is status post total thyroidectomy on April 14, 2018 with benign outcomes.   Review of systems  Constitutional: + decreasing body weight,  current Body mass index is 25.64 kg/m. , no fatigue, no subjective hyperthermia, no subjective hypothermia Eyes: no blurry vision, no xerophthalmia ENT: no sore throat, no nodules palpated in throat, no dysphagia/odynophagia, no hoarseness Cardiovascular: no chest pain, no shortness of breath, no palpitations, no leg swelling Respiratory: no cough, no shortness of breath Gastrointestinal: no nausea/vomiting/diarrhea Musculoskeletal: chronic knee pain- not candidate for surgery, hx of RA Skin: no rashes, no hyperemia Neurological: no tremors, no numbness, no tingling, no dizziness Psychiatric: no depression, no anxiety   Objective:    BP (!) 146/75 (BP Location: Left Arm, Patient Position: Sitting)   Pulse 66   Ht 5' 2 (1.575 m)   Wt 140 lb 3.2 oz (63.6 kg)   BMI 25.64 kg/m   Wt Readings from Last 3 Encounters:  09/27/23 140 lb 3.2 oz (63.6 kg)  09/26/22 159 lb 6.4 oz (72.3 kg)  02/16/22 157 lb (71.2 kg)    BP Readings from Last 3 Encounters:  09/27/23 (!) 146/75  09/26/22 (!) 160/88  02/16/22 (!) 145/79     Physical Exam- Limited  Constitutional:  Body mass index is 25.64 kg/m. , not in acute distress, normal state of mind Eyes:  EOMI, no exophthalmos Musculoskeletal: no gross deformities, strength intact in all four extremities, no gross restriction of joint movements Skin:  no rashes, no hyperemia Neurological: no tremor with outstretched hands   Recent Results (from the past 2160 hours)  TSH     Status: None   Collection Time: 09/26/23 12:13 PM  Result Value Ref Range   TSH 1.170  0.450 - 4.500 uIU/mL  T4, free     Status: Abnormal   Collection Time: 09/26/23 12:13 PM  Result Value Ref Range   Free T4 1.85 (H) 0.82 - 1.77 ng/dL    July 70,7980: Surgical pathology-- negative for malignancy.    Latest Reference Range & Units 12/04/19 11:01 06/21/20 11:18  01/03/21 10:50 07/27/21 09:44 01/30/22 10:53 09/26/23 12:13  TSH 0.450 - 4.500 uIU/mL 0.59 2.360 0.438 (L) 2.490 4.530 (H) 1.170  T4,Free(Direct) 0.82 - 1.77 ng/dL 1.3 8.87 8.62 8.89 8.81 1.85 (H)  (L): Data is abnormally low (H): Data is abnormally high   Assessment & Plan:   1. Post Surgical Hypothyroidism r/t MNG  Her previsit thyroid  function tests are consistent with appropriate hormone replacement (Free T4 is a tiny bit elevated but she denies any symptoms of over-replacement).  She is advised to continue Levothyroxine  88 mcg p.o. daily before breakfast.     - We discussed about the correct intake of her thyroid  hormone, on empty stomach at fasting, with water, separated by at least 30 minutes from breakfast and other medications,  and separated by more than 4 hours from calcium , iron, multivitamins, acid reflux medications (PPIs). -Patient is made aware of the fact that thyroid  hormone replacement is needed for life, dose to be adjusted by periodic monitoring of thyroid  function tests.     - I advised her to maintain close follow up with Pallone, Alan ORN, MD for primary care needs.    I spent  16  minutes in the care of the patient today including review of labs from Thyroid  Function, CMP, and other relevant labs ; imaging/biopsy records (current and previous including abstractions from other facilities); face-to-face time discussing  her lab results and symptoms, medications doses, her options of short and long term treatment based on the latest standards of care / guidelines;   and documenting the encounter.  Bridget ORN Woods  participated in the discussions, expressed understanding, and voiced  agreement with the above plans.  All questions were answered to her satisfaction. she is encouraged to contact clinic should she have any questions or concerns prior to her return visit.   Follow up plan: Return in about 1 year (around 09/26/2024) for Thyroid  follow up, Previsit labs.   Benton Rio, [facility] [address] [place], [place] [address] Phone: [phone] Fax: [phone]   09/27/2023, 11:03 AM

## 2024-01-08 ENCOUNTER — Other Ambulatory Visit (HOSPITAL_COMMUNITY): Payer: Self-pay | Admitting: Family Medicine

## 2024-01-08 ENCOUNTER — Encounter (HOSPITAL_COMMUNITY): Payer: Self-pay | Admitting: Family Medicine

## 2024-01-08 DIAGNOSIS — R5383 Other fatigue: Secondary | ICD-10-CM

## 2024-01-08 DIAGNOSIS — R634 Abnormal weight loss: Secondary | ICD-10-CM

## 2024-01-13 ENCOUNTER — Ambulatory Visit (HOSPITAL_COMMUNITY)
Admission: RE | Admit: 2024-01-13 | Discharge: 2024-01-13 | Disposition: A | Discharge status: Home / Self Care | Source: Ambulatory Visit | Attending: Family Medicine | Admitting: Family Medicine

## 2024-01-13 DIAGNOSIS — R5383 Other fatigue: Secondary | ICD-10-CM | POA: Diagnosis present

## 2024-01-13 DIAGNOSIS — R634 Abnormal weight loss: Secondary | ICD-10-CM | POA: Diagnosis present

## 2024-01-20 ENCOUNTER — Emergency Department (HOSPITAL_COMMUNITY)
Admission: EM | Admit: 2024-01-20 | Discharge: 2024-01-21 | Disposition: A | Discharge status: Home / Self Care | Attending: Emergency Medicine | Admitting: Emergency Medicine

## 2024-01-20 ENCOUNTER — Emergency Department (HOSPITAL_COMMUNITY)

## 2024-01-20 ENCOUNTER — Other Ambulatory Visit: Payer: Self-pay

## 2024-01-20 ENCOUNTER — Encounter (HOSPITAL_COMMUNITY): Payer: Self-pay

## 2024-01-20 DIAGNOSIS — K5732 Diverticulitis of large intestine without perforation or abscess without bleeding: Secondary | ICD-10-CM | POA: Diagnosis not present

## 2024-01-20 DIAGNOSIS — Z7982 Long term (current) use of aspirin: Secondary | ICD-10-CM | POA: Diagnosis not present

## 2024-01-20 DIAGNOSIS — R42 Dizziness and giddiness: Secondary | ICD-10-CM | POA: Diagnosis not present

## 2024-01-20 DIAGNOSIS — E872 Acidosis, unspecified: Secondary | ICD-10-CM | POA: Diagnosis not present

## 2024-01-20 DIAGNOSIS — I10 Essential (primary) hypertension: Secondary | ICD-10-CM | POA: Insufficient documentation

## 2024-01-20 DIAGNOSIS — E860 Dehydration: Secondary | ICD-10-CM | POA: Insufficient documentation

## 2024-01-20 DIAGNOSIS — Z79899 Other long term (current) drug therapy: Secondary | ICD-10-CM | POA: Diagnosis not present

## 2024-01-20 DIAGNOSIS — R0602 Shortness of breath: Secondary | ICD-10-CM | POA: Diagnosis present

## 2024-01-20 HISTORY — DX: Anemia, unspecified: D64.9

## 2024-01-20 LAB — COMPREHENSIVE METABOLIC PANEL WITH GFR
ALT: 11 U/L (ref 0–44)
AST: 16 U/L (ref 15–41)
Albumin: 3.5 g/dL (ref 3.5–5.0)
Alkaline Phosphatase: 63 U/L (ref 38–126)
Anion gap: 14 (ref 5–15)
BUN: 23 mg/dL (ref 8–23)
CO2: 19 mmol/L — ABNORMAL LOW (ref 22–32)
Calcium: 9.1 mg/dL (ref 8.9–10.3)
Chloride: 101 mmol/L (ref 98–111)
Creatinine, Ser: 1.03 mg/dL — ABNORMAL HIGH (ref 0.44–1.00)
GFR, Estimated: 59 mL/min — ABNORMAL LOW (ref >60)
Glucose, Bld: 85 mg/dL (ref 70–99)
Potassium: 3.9 mmol/L (ref 3.5–5.1)
Sodium: 134 mmol/L — ABNORMAL LOW (ref 135–145)
Total Bilirubin: 1.7 mg/dL — ABNORMAL HIGH (ref 0.0–1.2)
Total Protein: 7.6 g/dL (ref 6.5–8.1)

## 2024-01-20 LAB — TROPONIN I (HIGH SENSITIVITY)
Troponin I (High Sensitivity): 4 ng/L (ref <18)
Troponin I (High Sensitivity): 4 ng/L (ref <18)

## 2024-01-20 LAB — MAGNESIUM: Magnesium: 1.4 mg/dL — ABNORMAL LOW (ref 1.7–2.4)

## 2024-01-20 LAB — CBC WITH DIFFERENTIAL/PLATELET
Abs Immature Granulocytes: 0.04 10*3/uL (ref 0.00–0.07)
Basophils Absolute: 0 10*3/uL (ref 0.0–0.1)
Basophils Relative: 0 %
Eosinophils Absolute: 0.1 10*3/uL (ref 0.0–0.5)
Eosinophils Relative: 1 %
HCT: 32.9 % — ABNORMAL LOW (ref 36.0–46.0)
Hemoglobin: 11.1 g/dL — ABNORMAL LOW (ref 12.0–15.0)
Immature Granulocytes: 0 %
Lymphocytes Relative: 37 %
Lymphs Abs: 3.7 10*3/uL (ref 0.7–4.0)
MCH: 35.1 pg — ABNORMAL HIGH (ref 26.0–34.0)
MCHC: 33.7 g/dL (ref 30.0–36.0)
MCV: 104.1 fL — ABNORMAL HIGH (ref 80.0–100.0)
Monocytes Absolute: 0.8 10*3/uL (ref 0.1–1.0)
Monocytes Relative: 8 %
Neutro Abs: 5.5 10*3/uL (ref 1.7–7.7)
Neutrophils Relative %: 54 %
Platelets: 585 10*3/uL — ABNORMAL HIGH (ref 150–400)
RBC: 3.16 MIL/uL — ABNORMAL LOW (ref 3.87–5.11)
RDW: 12.9 % (ref 11.5–15.5)
WBC: 10.2 10*3/uL (ref 4.0–10.5)
nRBC: 0 % (ref 0.0–0.2)

## 2024-01-20 LAB — TYPE AND SCREEN
ABO/RH(D): B POS
Antibody Screen: NEGATIVE

## 2024-01-20 LAB — BRAIN NATRIURETIC PEPTIDE: B Natriuretic Peptide: 27 pg/mL (ref 0.0–100.0)

## 2024-01-20 LAB — D-DIMER, QUANTITATIVE: D-Dimer, Quant: 1.08 ug{FEU}/mL — ABNORMAL HIGH (ref 0.00–0.50)

## 2024-01-20 MED ORDER — IOHEXOL 350 MG/ML SOLN
100.0000 mL | Freq: Once | INTRAVENOUS | Status: AC | PRN
  Administered 2024-01-20: 100 mL via INTRAVENOUS

## 2024-01-20 MED ORDER — SODIUM CHLORIDE 0.9 % IV BOLUS
500.0000 mL | Freq: Once | INTRAVENOUS | Status: AC
  Administered 2024-01-20: 500 mL via INTRAVENOUS

## 2024-01-20 MED ORDER — ONDANSETRON 4 MG PO TBDP: 4.0000 mg | ORAL_TABLET | Freq: Three times a day (TID) | ORAL | 0 refills | Status: AC | PRN

## 2024-01-20 MED ORDER — LACTATED RINGERS IV BOLUS
1000.0000 mL | Freq: Once | INTRAVENOUS | Status: AC
  Administered 2024-01-20: 1000 mL via INTRAVENOUS

## 2024-01-20 MED ORDER — MAGNESIUM SULFATE 2 GM/50ML IV SOLN
2.0000 g | Freq: Once | INTRAVENOUS | Status: AC
  Administered 2024-01-20: 2 g via INTRAVENOUS
  Filled 2024-01-20: qty 50

## 2024-01-20 MED ORDER — SODIUM CHLORIDE 0.9 % IV BOLUS
1000.0000 mL | Freq: Once | INTRAVENOUS | Status: AC
  Administered 2024-01-20: 1000 mL via INTRAVENOUS

## 2024-01-20 NOTE — Discharge Instructions (Addendum)
 Continue your antibiotics for diverticulitis.  A prescription for a medication called ondansetron  was sent to your pharmacy.  Take this as needed for nausea.  Drink plenty of fluids at home in addition to food, meal replacements, and electrolyte containing beverages.    Return to the emergency department for any new or worsening symptoms of concern.

## 2024-01-20 NOTE — ED Provider Triage Note (Signed)
 Emergency Medicine Provider Triage Evaluation Note  Bridget Woods , a 68 y.o. female  was evaluated in triage.  Pt complains of generalized malaise and fatigue, dizziness, poor p.o. intake, shortness of breath.  Patient notes that she had "food poisoning" 2 weeks ago and pneumonia last week.  Review of Systems  Positive: Fatigue, dizziness, poor p.o. intake and shortness of breath Negative: Vomiting, diarrhea  Physical Exam  BP 104/63   Pulse 85   Temp 98.3 F (36.8 C)   Resp (!) 24   Ht 5\' 2"  (1.575 m)   Wt 56.2 kg   SpO2 100%   BMI 22.68 kg/m  Gen:   Awake, no distress   Resp:  Tachypnea MSK:   Moves extremities without difficulty  Other:    Medical Decision Making  Medically screening exam initiated at 6:32 PM.  Appropriate orders placed.  Bridget Woods was informed that the remainder of the evaluation will be completed by another provider, this initial triage assessment does not replace that evaluation, and the importance of remaining in the ED until their evaluation is complete.  Labs and imaging ordered in triage.  Awaiting bed placement at this time.   Roselynn Connors, PA-C 01/20/24 1832

## 2024-01-20 NOTE — ED Provider Notes (Signed)
 [place] EMERGENCY DEPARTMENT AT [facility] Provider Note   CSN: [id number] Arrival date & time: 01/20/24  1614     History  Chief Complaint  Patient presents with   Shortness of Breath    Bridget Woods is a 68 y.o. female.   Shortness of Breath Associated symptoms: chest pain and sore throat   Patient presents for shortness of breath.  Medical history includes anxiety, depression, fibromyalgia, HTN, anemia, GERD, arthritis.  She was recently seen for GI upset a month ago.  She developed flulike symptoms 2 weeks ago.  She was seen by PCP.  She was prescribed azithromycin and prednisone.  She was also advised to take over-the-counter cold and flu remedies and cough suppressant as needed.  She underwent outpatient CT scan which did show findings consistent with diverticulitis.  She was prescribed Flagyl.  Starting yesterday, patient had shortness of breath and exercise intolerance.  She describes a central pleuritic chest pain.  She has had less abdominal discomfort.  She does report ongoing nausea when she tries to eat.  She did take MiraLAX yesterday and has had some loose stools.     Home Medications Prior to Admission medications   Medication Sig Start Date End Date Taking? Authorizing Provider  linaclotide (LINZESS) 290 MCG CAPS capsule Take 290 mcg by mouth daily before breakfast.   Yes [provider]  ondansetron  (ZOFRAN -ODT) 4 MG disintegrating tablet Take 1 tablet (4 mg total) by mouth every 8 (eight) hours as needed for nausea or vomiting. 01/20/24  Yes Iva Mariner, MD  ALPRAZolam  (XANAX ) 0.5 MG tablet Take 0.5 mg by mouth 3 (three) times daily as needed for anxiety. 02/17/18   [provider]  aspirin  EC 81 MG tablet Take 81 mg by mouth at bedtime.     [provider]  aspirin -acetaminophen -caffeine (EXCEDRIN MIGRAINE) 250-250-65 MG tablet Take 2 tablets by mouth 2 (two) times daily as needed for headache or migraine.    [provider]  atorvastatin  (LIPITOR) 10 MG tablet Take 10 mg by mouth daily. 12/31/17   [provider]  calcium -vitamin D  (OSCAL WITH D) 500-200 MG-UNIT tablet Take 1 tablet by mouth 2 (two) times daily.    [provider]  diltiazem  (CARDIZEM  CD) 180 MG 24 hr capsule Take 180 mg by mouth daily. 10/03/17   [provider]  DULoxetine  (CYMBALTA ) 60 MG capsule Take 60 mg by mouth 2 (two) times daily.    [provider]  ENBREL MINI 50 MG/ML SOCT Inject into the skin. 05/13/21   [provider]  hydrALAZINE  (APRESOLINE ) 25 MG tablet Take 25 mg by mouth 3 (three) times daily.    [provider]  HYDROcodone -acetaminophen  (NORCO) 5-325 MG tablet Take 1 tablet by mouth every 4 (four) hours as needed for moderate pain. 04/15/18   Alanda Allegra, MD  levothyroxine  (SYNTHROID ) 88 MCG tablet Take 1 tablet (88 mcg total) by mouth daily before breakfast. 09/27/23   Wendel Hals, NP  losartan  (COZAAR ) 50 MG tablet Take 50 mg by mouth daily. 05/16/21   [provider]  pantoprazole (PROTONIX) 40 MG tablet Take 40 mg by mouth daily. 06/26/21   [provider]  potassium chloride  SA (K-DUR,KLOR-CON ) 20 MEQ tablet Take 20 mEq by mouth 2 (two) times daily.    [provider]  predniSONE (DELTASONE) 5 MG tablet Take 5 mg by mouth daily as needed. For rheumatoid arthritis flare ups 01/21/18   [provider]  propranolol  (  INDERAL ) 40 MG tablet Take 60 mg by mouth 2 (two) times daily.    [provider]  QUEtiapine  (SEROQUEL ) 25 MG tablet Take 1 tablet by mouth at bedtime. 12/30/20   [provider]  QULIPTA  60 MG TABS Take 1 tablet by mouth at bedtime. 12/30/20   [provider]  tiZANidine  (ZANAFLEX ) 4 MG tablet Take 4 mg by mouth See admin instructions. Take 1 tablet (4 mg) in the morning & 2 tablets (8 mg) at bedtime. 01/21/18   [provider]  TRULANCE 3 MG TABS Take 1 tablet by mouth daily.  07/24/21   [provider]  UBRELVY 100 MG TABS Take 1 tablet by mouth daily as needed (headache). 12/16/20   [provider]      Allergies    Naproxen, Cephalexin, and Ciprofibrate    Review of Systems   Review of Systems  Constitutional:  Positive for activity change, appetite change and fatigue.  HENT:  Positive for sore throat.   Respiratory:  Positive for shortness of breath.   Cardiovascular:  Positive for chest pain.  Gastrointestinal:  Positive for diarrhea and nausea.  All other systems reviewed and are negative.   Physical Exam Updated Vital Signs BP 137/80   Pulse 90   Temp 98.1 F (36.7 C)   Resp 20   Ht 5\' 2"  (1.575 m)   Wt 56.2 kg   SpO2 99%   BMI 22.68 kg/m  Physical Exam Vitals and nursing note reviewed.  Constitutional:      General: She is not in acute distress.    Appearance: She is well-developed. She is ill-appearing. She is not toxic-appearing or diaphoretic.  HENT:     Head: Normocephalic and atraumatic.     Mouth/Throat:     Mouth: Mucous membranes are moist.  Eyes:     Conjunctiva/sclera: Conjunctivae normal.  Cardiovascular:     Rate and Rhythm: Normal rate and regular rhythm.     Heart sounds: No murmur heard. Pulmonary:     Effort: Pulmonary effort is normal. Tachypnea present. No respiratory distress.     Breath sounds: No decreased breath sounds, wheezing, rhonchi or rales.  Chest:     Chest wall: No tenderness.  Abdominal:     Palpations: Abdomen is soft.     Tenderness: There is no abdominal tenderness.  Musculoskeletal:        General: No swelling. Normal range of motion.     Cervical back: Neck supple.  Skin:    General: Skin is warm and dry.     Coloration: Skin is not cyanotic or pale.  Neurological:     General: No focal deficit present.     Mental Status: She is alert and oriented to person, place, and time.  Psychiatric:        Mood and Affect: Mood normal.        Behavior: Behavior normal.     ED  Results / Procedures / Treatments   Labs (all labs ordered are listed, but only abnormal results are displayed) Labs Reviewed  CBC WITH DIFFERENTIAL/PLATELET - Abnormal; Notable for the following components:      Result Value   RBC 3.16 (*)    Hemoglobin 11.1 (*)    HCT 32.9 (*)    MCV 104.1 (*)    MCH 35.1 (*)    Platelets 585 (*)    All other components within normal limits  COMPREHENSIVE METABOLIC PANEL WITH GFR - Abnormal; Notable for the following  components:   Sodium 134 (*)    CO2 19 (*)    Creatinine, Ser 1.03 (*)    Total Bilirubin 1.7 (*)    GFR, Estimated 59 (*)    All other components within normal limits  D-DIMER, QUANTITATIVE - Abnormal; Notable for the following components:   D-Dimer, Quant 1.08 (*)    All other components within normal limits  MAGNESIUM  - Abnormal; Notable for the following components:   Magnesium  1.4 (*)    All other components within normal limits  BRAIN NATRIURETIC PEPTIDE  TYPE AND SCREEN  TROPONIN I (HIGH SENSITIVITY)  TROPONIN I (HIGH SENSITIVITY)    EKG EKG Interpretation Date/Time:  Monday Jan 20 2024 19:55:39 EDT Ventricular Rate:  83 PR Interval:  164 QRS Duration:  79 QT Interval:  379 QTC Calculation: 446 R Axis:   -5  Text Interpretation: Sinus rhythm Confirmed by Iva Mariner ([id number]) on 01/20/2024 8:14:08 PM  Radiology CT ABDOMEN PELVIS W CONTRAST Result Date: 01/20/2024 CLINICAL DATA:  Abdominal pain, acute, nonlocalized, dyspnea, weakness, dizziness EXAM: CT ABDOMEN AND PELVIS WITH CONTRAST TECHNIQUE: Multidetector CT imaging of the abdomen and pelvis was performed using the standard protocol following bolus administration of intravenous contrast. RADIATION DOSE REDUCTION: This exam was performed according to the departmental dose-optimization program which includes automated exposure control, adjustment of the mA and/or kV according to patient size and/or use of iterative reconstruction technique. CONTRAST:  OMNIPAQUE   IOHEXOL  350 MG/ML SOLN COMPARISON:  June 03, 2020 FINDINGS: Lower chest: For findings above the diaphragm, please see the separately dictated CT of the chest report, which was performed concurrently. Hepatobiliary: In the medial right hepatic lobe adjacent to the IVC, there is a 1.8 cm hypodensity (axial 24), with subtle region of peripheral nodular enhancement. Focal fatty infiltration along the falciform ligament. Subtle region of geographic hypoenhancement within segment 4 of the liver (best visualized on sagittal image 25). Few vague areas of hypoenhancement are also suggested in the superior right hepatic lobe. No radiopaque stones or wall thickening of the gallbladder. No intrahepatic or extrahepatic biliary ductal dilation. The portal veins are patent. Pancreas: No mass or main ductal dilation. No peripancreatic inflammation or fluid collection. Spleen: Normal size. No mass. Adrenals/Urinary Tract: No adrenal masses. Multiple bilateral renal cysts. No nephrolithiasis or hydronephrosis. Asymmetric wall thickening along the left bladder dome. Stomach/Bowel: Small sliding-type hiatal hernia. The stomach is decompressed without focal abnormality. No small bowel wall thickening or inflammation. No small bowel obstruction.The appendix was not visualized. No right lower quadrant or pericecal inflammatory changes to suggest acute appendicitis. Diffuse diverticulosis throughout the descending and sigmoid colon. There is wall thickening and pericolonic inflammation about the mid sigmoid colon. No peridiverticular abscess. Vascular/Lymphatic: No aortic aneurysm. Diffuse aortoiliac atherosclerosis. No intraabdominal or pelvic lymphadenopathy. Reproductive: Hysterectomy. No concerning adnexal mass. No free pelvic fluid. Other: No pneumoperitoneum, ascites, or mesenteric inflammation. Musculoskeletal: No acute fracture or destructive lesion. Osteopenia. Multilevel degenerative disc disease of the spine. Mild grade 1  anterolisthesis of L4 on L5. Chronic compression fracture of L2 status post vertebroplasty. IMPRESSION: 1. Redemonstrated findings of sigmoid diverticulitis. No peridiverticular abscess or pneumoperitoneum. 2. Close approximation of the urinary bladder to the sigmoid diverticulitis with asymmetric bladder wall thickening in this region, likely reactive. No gas within the bladder lumen to suggest colovesicular fistula, at this time. 3. Subtle regions of geographic hypoenhancement within the right hepatic lobe and segment 4 of the liver, which may be due to the phase of contrast. Alternatively, this may  reflect changes of acute hepatitis. Correlation with liver enzymes recommended. 4. Small hiatal hernia. Aortic Atherosclerosis (ICD10-I70.0). Electronically Signed   By: Rance Burrows M.D.   On: 01/20/2024 21:45   CT Angio Chest PE W and/or Wo Contrast Result Date: 01/20/2024 CLINICAL DATA:  Pulmonary embolism (PE) suspected, high prob, dizziness, difficulty breathing, dyspnea, and weakness EXAM: CT ANGIOGRAPHY CHEST WITH CONTRAST TECHNIQUE: Multidetector CT imaging of the chest was performed using the standard protocol during bolus administration of intravenous contrast. Multiplanar CT image reconstructions and MIPs were obtained to evaluate the vascular anatomy. RADIATION DOSE REDUCTION: This exam was performed according to the departmental dose-optimization program which includes automated exposure control, adjustment of the mA and/or kV according to patient size and/or use of iterative reconstruction technique. CONTRAST:  OMNIPAQUE  IOHEXOL  350 MG/ML SOLN COMPARISON:  Jan 20, 2024 FINDINGS: Pulmonary Embolism: No pulmonary embolism. Cardiovascular: No cardiomegaly or pericardial effusion. No aortic aneurysm. Scattered aortic atherosclerosis. Mediastinum/Nodes: No mediastinal mass. No mediastinal, hilar, or axillary lymphadenopathy. Thyroidectomy. Lungs/Pleura: The midline trachea and bronchi are patent. No  focal airspace consolidation, pleural effusion, or pneumothorax. Multifocal subsegmental atelectasis in the lung bases. Fibrolinear scarring in the lingula. Musculoskeletal: No acute fracture or destructive bone lesion. Multilevel degenerative disc disease of the spine. Upper Abdomen: For findings below the diaphragm, please see the separately dictated CT of the abdomen and pelvis report, which was performed concurrently. Review of the MIP images confirms the above findings. IMPRESSION: No acute intrathoracic abnormality; specifically, no pulmonary embolism, pneumonia, or pleural effusion. Aortic Atherosclerosis (ICD10-I70.0). Electronically Signed   By: Rance Burrows M.D.   On: 01/20/2024 21:31   CT Head Wo Contrast Result Date: 01/20/2024 CLINICAL DATA:  Syncope/presyncope, cerebrovascular cause suspected EXAM: CT HEAD WITHOUT CONTRAST TECHNIQUE: Contiguous axial images were obtained from the base of the skull through the vertex without intravenous contrast. RADIATION DOSE REDUCTION: This exam was performed according to the departmental dose-optimization program which includes automated exposure control, adjustment of the mA and/or kV according to patient size and/or use of iterative reconstruction technique. COMPARISON:  MRI head 05/18/2021. FINDINGS: Brain: No evidence of acute infarction, hemorrhage, hydrocephalus, extra-axial collection or mass lesion/mass effect. Vascular: No hyperdense vessel. Skull: No acute fracture. Sinuses/Orbits: Clear sinuses.  No acute orbital findings. IMPRESSION: No evidence of acute intracranial abnormality. Electronically Signed   By: Stevenson Elbe M.D.   On: 01/20/2024 19:35   DG Chest 2 View Result Date: 01/20/2024 CLINICAL DATA:  Shortness of breath EXAM: CHEST - 2 VIEW COMPARISON:  05/17/2021 FINDINGS: Linear scarring in the lung bases. No acute confluent opacities or effusions. Heart and mediastinal contours within normal limits. No acute bony abnormality.  IMPRESSION: No active cardiopulmonary disease. Electronically Signed   By: Janeece Mechanic M.D.   On: 01/20/2024 17:56    Procedures Procedures    Medications Ordered in ED Medications  magnesium  sulfate IVPB 2 g 50 mL (2 g Intravenous New Bag/Given 01/20/24 2338)  sodium chloride  0.9 % bolus 500 mL (0 mLs Intravenous Stopped 01/20/24 2204)  iohexol  (OMNIPAQUE ) 350 MG/ML injection 100 mL (100 mLs Intravenous Contrast Given 01/20/24 2050)  lactated ringers  bolus 1,000 mL (0 mLs Intravenous Stopped 01/20/24 2339)  sodium chloride  0.9 % bolus 1,000 mL (1,000 mLs Intravenous New Bag/Given 01/20/24 2344)    ED Course/ Medical Decision Making/ A&P  Medical Decision Making Amount and/or Complexity of Data Reviewed Labs: ordered. Radiology: ordered.  Risk Prescription drug management.   This patient presents to the ED for concern of shortness of breath, this involves an extensive number of treatment options, and is a complaint that carries with it a high risk of complications and morbidity.  The differential diagnosis includes pneumonia, CHF, reactive airway disease, anemia, acidosis, other metabolic derangements   Co morbidities that complicate the patient evaluation  anxiety, depression, fibromyalgia, HTN, anemia, GERD, arthritis   Additional history obtained:  Additional history obtained from patient's family External records from outside source obtained and reviewed including EMR   Lab Tests:  I Ordered, and personally interpreted labs.  The pertinent results include: Baseline hemoglobin, similar thrombocytosis when compared to lab work from 2 weeks ago.  Mild increase in creatinine from baseline.  There is a mild non-anion gap metabolic acidosis.  Hypomagnesemia is present with otherwise normal electrolytes.  BNP and troponin are normal.   Imaging Studies ordered:  I ordered imaging studies including chest x-ray, CT head, CTA chest, CT of abdomen  pelvis I independently visualized and interpreted imaging which showed uncomplicated sigmoid diverticulitis, likely reactive asymmetric bowel wall thickening I agree with the radiologist interpretation   Cardiac Monitoring: / EKG:  The patient was maintained on a cardiac monitor.  I personally viewed and interpreted the cardiac monitored which showed an underlying rhythm of: Sinus rhythm   Problem List / ED Course / Critical interventions / Medication management  Patient presenting for shortness of breath since yesterday.  Symptoms become severe with any activity.  On arrival in the ED, SpO2 is normal on room air.  Patient is tachypneic with mildly increased work of breathing.  She is able to speak in complete sentences.  Her lungs are clear to auscultation.  She does describe a pleuritic central chest pain.  Although she endorsed sore throat, oropharynx is normal in appearance.  Abdomen soft and nontender.  Patient was placed on monitor.  Workup was initiated.  Lab work was notable for mild increase in creatinine from baseline.  2 weeks ago, creatinine was 0.81.  This is consistent with a mild AKI.  IV fluids were ordered.  Patient also has a mild non-anion gap metabolic acidosis.  I suspect this is from poor p.o. intake over the past month.  Patient endorses unintentional 20 pound weight loss.  She describes no appetite and intermittent nausea.  She underwent imaging studies which showed redemonstration of known sigmoid diverticulitis.  She is on antibiotics for this.  No other acute findings were identified.  I suspect that patient's fatigue and shortness of breath are secondary to dehydration as well as some mild acidosis.  Following 1 L of IV fluid, she was stood up and did endorse improvement in her shortness of breath.  She had a 30 point drop in her SBP with standing.  I suspect she is still volume down.  Additional IV fluids were ordered in addition to magnesium  replacement.  Patient is in favor  of discharge home.  A prescription for Zofran  was sent to her pharmacy to take as needed.  Patient was advised to continue good hydration at home in addition to electrolytes and meal replacements.  Plan will be for discharge following infusion of IVF and magnesium  sulfate. I ordered medication including IV fluids for hydration; magnesium  sulfate for hypomagnesemia Reevaluation of the patient after these medicines showed that the patient improved I have reviewed the patients home medicines and  have made adjustments as needed  Social Determinants of Health:  Lives at home with family         Final Clinical Impression(s) / ED Diagnoses Final diagnoses:  Dehydration  Hypomagnesemia  Metabolic acidosis  Sigmoid diverticulitis    Rx / DC Orders ED Discharge Orders          Ordered    ondansetron  (ZOFRAN -ODT) 4 MG disintegrating tablet  Every 8 hours PRN        01/20/24 2345              Iva Mariner, MD 01/20/24 2348

## 2024-01-20 NOTE — ED Triage Notes (Signed)
 Pt arrived via POV c/o worsening SOB, difficulty breathing, weakness, dizziness and reports recently being told she was anemic. Pt reports recent food poisoning and she hasn't felt too good.

## 2024-08-25 ENCOUNTER — Other Ambulatory Visit: Payer: Self-pay | Admitting: *Deleted

## 2024-08-25 DIAGNOSIS — E890 Postprocedural hypothyroidism: Secondary | ICD-10-CM

## 2024-08-25 MED ORDER — LEVOTHYROXINE SODIUM 88 MCG PO TABS: 88.0000 ug | ORAL_TABLET | Freq: Every day | ORAL | 3 refills | Status: AC

## 2024-08-25 NOTE — Telephone Encounter (Signed)
 Rec'd a request from the [facility] for a refill for the patient's Thyroid  Medication.

## 2024-09-15 ENCOUNTER — Telehealth: Payer: Self-pay | Admitting: Nurse Practitioner

## 2024-09-15 ENCOUNTER — Other Ambulatory Visit: Payer: Self-pay | Admitting: Nurse Practitioner

## 2024-09-15 DIAGNOSIS — E890 Postprocedural hypothyroidism: Secondary | ICD-10-CM

## 2024-09-15 NOTE — Telephone Encounter (Signed)
 Update Labs please

## 2024-09-15 NOTE — Telephone Encounter (Signed)
 done

## 2024-09-23 LAB — TSH: TSH: 2.58 u[IU]/mL (ref 0.450–4.500)

## 2024-09-23 LAB — T4, FREE: Free T4: 1.57 ng/dL (ref 0.82–1.77)

## 2024-09-25 ENCOUNTER — Ambulatory Visit: Payer: Medicare HMO | Admitting: Nurse Practitioner

## 2024-09-25 DIAGNOSIS — E890 Postprocedural hypothyroidism: Secondary | ICD-10-CM
